# Patient Record
Sex: Female | Born: 1990 | Race: White | Hispanic: No | Marital: Single | Smoking: Former smoker
Health system: Southern US, Community
[De-identification: ages and names within clinical notes are randomized; demographics above are authoritative.]

## PROBLEM LIST (undated history)

## (undated) DIAGNOSIS — T50901A Poisoning by unspecified drugs, medicaments and biological substances, accidental (unintentional), initial encounter: Secondary | ICD-10-CM

## (undated) DIAGNOSIS — Z8619 Personal history of other infectious and parasitic diseases: Secondary | ICD-10-CM

## (undated) DIAGNOSIS — Z87440 Personal history of urinary (tract) infections: Secondary | ICD-10-CM

## (undated) DIAGNOSIS — F419 Anxiety disorder, unspecified: Secondary | ICD-10-CM

## (undated) DIAGNOSIS — A549 Gonococcal infection, unspecified: Secondary | ICD-10-CM

## (undated) DIAGNOSIS — A749 Chlamydial infection, unspecified: Secondary | ICD-10-CM

## (undated) DIAGNOSIS — F329 Major depressive disorder, single episode, unspecified: Secondary | ICD-10-CM

## (undated) DIAGNOSIS — J9600 Acute respiratory failure, unspecified whether with hypoxia or hypercapnia: Secondary | ICD-10-CM

## (undated) DIAGNOSIS — L709 Acne, unspecified: Secondary | ICD-10-CM

## (undated) DIAGNOSIS — R569 Unspecified convulsions: Secondary | ICD-10-CM

## (undated) DIAGNOSIS — F32A Depression, unspecified: Secondary | ICD-10-CM

## (undated) HISTORY — PX: COLPOSCOPY: SHX161

## (undated) HISTORY — DX: Unspecified convulsions: R56.9

## (undated) HISTORY — DX: Acne, unspecified: L70.9

## (undated) HISTORY — PX: TYMPANOSTOMY TUBE PLACEMENT: SHX32

## (undated) HISTORY — DX: Gonococcal infection, unspecified: A54.9

## (undated) HISTORY — DX: Personal history of urinary (tract) infections: Z87.440

## (undated) HISTORY — DX: Major depressive disorder, single episode, unspecified: F32.9

## (undated) HISTORY — PX: GYNECOLOGIC CRYOSURGERY: SHX857

## (undated) HISTORY — DX: Anxiety disorder, unspecified: F41.9

## (undated) HISTORY — DX: Chlamydial infection, unspecified: A74.9

## (undated) HISTORY — DX: Personal history of other infectious and parasitic diseases: Z86.19

## (undated) HISTORY — DX: Depression, unspecified: F32.A

---
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
## 2008-01-11 ENCOUNTER — Other Ambulatory Visit: Admission: RE | Admit: 2008-01-11 | Discharge: 2008-01-11 | Payer: Self-pay | Admitting: Obstetrics and Gynecology

## 2008-05-11 ENCOUNTER — Ambulatory Visit: Payer: Self-pay | Admitting: Obstetrics and Gynecology

## 2008-06-07 ENCOUNTER — Ambulatory Visit: Payer: Self-pay | Admitting: Obstetrics and Gynecology

## 2008-07-04 ENCOUNTER — Ambulatory Visit: Payer: Self-pay | Admitting: Obstetrics and Gynecology

## 2008-10-24 ENCOUNTER — Ambulatory Visit: Payer: Self-pay | Admitting: Obstetrics and Gynecology

## 2009-03-20 ENCOUNTER — Other Ambulatory Visit: Admission: RE | Admit: 2009-03-20 | Discharge: 2009-03-20 | Payer: Self-pay | Admitting: Obstetrics and Gynecology

## 2009-03-20 ENCOUNTER — Encounter: Payer: Self-pay | Admitting: Obstetrics and Gynecology

## 2009-03-20 ENCOUNTER — Ambulatory Visit: Payer: Self-pay | Admitting: Obstetrics and Gynecology

## 2009-08-21 ENCOUNTER — Ambulatory Visit: Payer: Self-pay | Admitting: Obstetrics and Gynecology

## 2010-02-26 ENCOUNTER — Ambulatory Visit: Payer: Self-pay | Admitting: Obstetrics and Gynecology

## 2010-04-30 ENCOUNTER — Ambulatory Visit: Payer: Self-pay | Admitting: Internal Medicine

## 2010-04-30 DIAGNOSIS — J019 Acute sinusitis, unspecified: Secondary | ICD-10-CM

## 2010-04-30 DIAGNOSIS — R51 Headache: Secondary | ICD-10-CM

## 2010-04-30 DIAGNOSIS — F411 Generalized anxiety disorder: Secondary | ICD-10-CM | POA: Insufficient documentation

## 2010-04-30 DIAGNOSIS — R519 Headache, unspecified: Secondary | ICD-10-CM | POA: Insufficient documentation

## 2010-04-30 DIAGNOSIS — L708 Other acne: Secondary | ICD-10-CM

## 2010-09-25 NOTE — Letter (Signed)
Summary: Out of School  [facility]  [address] [place], [place] [address]   Phone: [phone]  Fax: [phone]    April 30, 2010   Student:  [name]    To Whom It May Concern:   For Medical reasons, please excuse the above named student from school for the following dates:  Start:   April 30, 2010  End:     If you need additional information, please feel free to contact our office.   Sincerely,    Madelin Headings MD    ****This is a legal document and cannot be tampered with.  Schools are authorized to verify all information and to do so accordingly.

## 2010-09-25 NOTE — Letter (Signed)
Summary: Out of School  [facility]  [address] [place], [place] [address]   Phone: [phone]  Fax: [phone]    April 30, 2010   Student:  Pearline Cables Weiskopf    To Whom It May Concern:   For Medical reasons, please excuse the above named student from school for the following dates:  Start:   April 30, 2010  End:    September 9th  or when fever is gone  If you need additional information, please feel free to contact our office.   Sincerely,    Madelin Headings MD    ****This is a legal document and cannot be tampered with.  Schools are authorized to verify all information and to do so accordingly.

## 2010-09-25 NOTE — Assessment & Plan Note (Signed)
Summary: NEW ACUTE/?SINUS INF/NJR   Vital Signs:  Patient profile:   20 year old female Menstrual status:  regular LMP:     03/31/2010 Height:      60.5 inches Weight:      104 pounds BMI:     20.05 Temp:     98.4 degrees F oral Pulse rate:   66 / minute BP sitting:   110 / 60  (right arm) Cuff size:   regular  Vitals Entered By: Romualdo Bolk, CMA (AAMA) (April 30, 2010 3:03 PM) CC: Sinus pressure, ha's, sore throat, coughing, fever this am, runny nose, congestion.  Pt has been taking dayquil. This started 3 days ago. LMP (date): 03/31/2010 LMP - Character: normal Menarche (age onset years): 12-15   Menses interval (days): 28 Menstrual flow (days): 3-5 Menstrual Status regular Enter LMP: 03/31/2010   History of Present Illness: Laurie Chaney comes in today  for new patient acute visit for above.  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 She has no primary care physician locally but sees Dr. Delle Reining for her anxiety management and Dr.Gottsegan for her gynecologist. she was in her usual state of health until the last three days when she developed significant head congestion and then face pain and headache. Fever this am 101  .  took daquil with some help. she feels that her symptoms are getting worse. She has no history of Athlon specific allergy but does have a remote history of occasional sinusitis.   She's under treatment for acneMinocycline once a day.    Preventive Screening-Counseling & Management  Alcohol-Tobacco     Alcohol drinks/day: 0     Smoking Status: quit  Caffeine-Diet-Exercise     Caffeine use/day: 0     Does Patient Exercise: yes  Comments: she was a social smoker in high school  Hep-HIV-STD-Contraception     Dental Visit-last 6 months yes  Safety-Violence-Falls     Firearms in the Home: no firearms in the home     Smoke Detectors: yes      Drug Use:  marijuana and yes ocass.    Current Medications  (verified): 1)  Klonopin 1 Mg Tabs (Clonazepam) .Marland Kitchen.. 1 By Mouth Three Times A Day As Needed 2)  Minocycline Hcl 100 Mg Caps (Minocycline Hcl) .Marland Kitchen.. 1 By Mouth Once Daily  Allergies (verified): No Known Drug Allergies  Past History:  Past Medical History: varicella  depression  hx of wellbutrin and lexapro.   seizure  disorder of childhood .Marland Kitchen out grown and none   since  4th grade .  total of 6  Anxiety Headache  Migraine .   Acne   on minocin.  previous pcp pediatrician.   was Dr Peter Congo    Past Surgical History: Tubes in ears age 27-4  Past History:  Care Management: Dermatology: Posey Rea of name Psychiatry: Evelene Croon  Family History: Father: Healthy Mother: HBP, depression, anxiety Siblings: Sister- Healthy; Brother-Healthy  Social History: Occupation: [employer]- Student Single HHo f 5 8 hours of sleep Former Smoker Alcohol use-no  graduated from [employer] Drug use-yes-History in [employer]  Regular exercise-yes works Engineer, manufacturing .    Smoking Status:  quit Caffeine use/day:  0 Does Patient Exercise:  yes Occupation:  employed Drug Use:  marijuana, yes ocass Dental Care w/in 6 mos.:  yes  Review of Systems       The patient complains of fever.  The patient denies anorexia, vision loss, decreased  hearing, hoarseness, chest pain, dyspnea on exertion, peripheral edema, prolonged cough, abdominal pain, melena, hematochezia, severe indigestion/heartburn, difficulty walking, abnormal bleeding, enlarged lymph nodes, and angioedema.         monthly  Has meds  as needed.   Physical Exam  General:  alert, well-developed, well-nourished, and well-hydrated.  very  congested in nad  Head:  normocephalic and atraumatic.   Eyes:  PERRL, EOMs full, conjunctiva clear  Ears:  scarring right tm  clear fluid left ear  no bulging or redness Nose:  4+ ocludded  tender facial pain and some frontal  Mouth:  tonsil 1+ no exudate or edema  Neck:  shoccy nodes  Lungs:  Normal  respiratory effort, chest expands symmetrically. Lungs are clear to auscultation, no crackles or wheezes.no dullness.   Heart:  Normal rate and regular rhythm. S1 and S2 normal without gallop, murmur, click, rub or other extra sounds.no lifts.   Abdomen:  Bowel sounds positive,abdomen soft and non-tender without masses, organomegaly or   noted. Msk:  no joint warmth and no redness over joints.   Pulses:  pulses intact without delay   Extremities:  no clubbing cyanosis or edema  Neurologic:  alert & oriented X3, strength normal in all extremities, and gait normal.   Skin:  turgor normal, color normal, no ecchymoses, no petechiae, tattoo(s), and body piercing(s).   Cervical Nodes:  No lymphadenopathy noted Inguinal Nodes:  No significant adenopathy Psych:  Oriented X3, normally interactive, and good eye contact.  nl speech and thought    Impression & Recommendations:  Problem # 1:  SINUSITIS - ACUTE-NOS (ICD-461.9) with fever  poss viral but symptoms more severe .  Her updated medication list for this problem includes:    Minocycline Hcl 100 Mg Caps (Minocycline hcl) .Marland Kitchen... 1 by mouth once daily    Amoxicillin-pot Clavulanate 875-125 Mg Tabs (Amoxicillin-pot clavulanate) .Marland Kitchen... 1 by mouth two times a day for sinusitis  Instructed on treatment. Call if symptoms persist or worsen.   Problem # 2:  ANXIETY (ICD-300.00) under treatment by psychiatrist, stable Her updated medication list for this problem includes:    Klonopin 1 Mg Tabs (Clonazepam) .Marland Kitchen... 1 by mouth three times a day as needed  Problem # 3:  ACNE, MILD (ICD-706.1) okay to stop the minocycline while she is on the other antibiotic. Her updated medication list for this problem includes:    Minocycline Hcl 100 Mg Caps (Minocycline hcl) .Marland Kitchen... 1 by mouth once daily    Amoxicillin-pot Clavulanate 875-125 Mg Tabs (Amoxicillin-pot clavulanate) .Marland Kitchen... 1 by mouth two times a day for sinusitis  Problem # 4:  HEADACHE (ICD-784.0) from  above     also possible hormonally incuded.  Complete Medication List: 1)  Klonopin 1 Mg Tabs (Clonazepam) .Marland Kitchen.. 1 by mouth three times a day as needed 2)  Minocycline Hcl 100 Mg Caps (Minocycline hcl) .Marland Kitchen.. 1 by mouth once daily 3)  Amoxicillin-pot Clavulanate 875-125 Mg Tabs (Amoxicillin-pot clavulanate) .Marland Kitchen.. 1 by mouth two times a day for sinusitis  Patient Instructions: 1)  call if  not getting better in the next 3-5 days or if fever after 2 days. 2)  can use afrin nose spray for 2-3 days to relieve the pressure . 3)  can temp stop the  minocin  while  on augmentin  Prescriptions: AMOXICILLIN-POT CLAVULANATE 875-125 MG TABS (AMOXICILLIN-POT CLAVULANATE) 1 by mouth two times a day for sinusitis  #20 x 0   Entered and Authorized by:   Neta Mends  Jayley Hustead MD   Signed by:   Madelin Headings MD on 04/30/2010   Method used:   Electronically to        [facility]* (retail)       [address]       [place], [place]  [id number]       Ph: [phone]       Fax: [phone]   RxID:   [id number]

## 2010-09-29 ENCOUNTER — Encounter (INDEPENDENT_AMBULATORY_CARE_PROVIDER_SITE_OTHER): Payer: Self-pay | Admitting: Gynecology

## 2010-09-29 ENCOUNTER — Other Ambulatory Visit (HOSPITAL_COMMUNITY)
Admission: RE | Admit: 2010-09-29 | Discharge: 2010-09-29 | Disposition: A | Discharge status: Home / Self Care | Payer: BC Managed Care – PPO | Source: Ambulatory Visit | Attending: Obstetrics and Gynecology | Admitting: Obstetrics and Gynecology

## 2010-09-29 ENCOUNTER — Other Ambulatory Visit: Payer: Self-pay | Admitting: Obstetrics and Gynecology

## 2010-09-29 DIAGNOSIS — R82998 Other abnormal findings in urine: Secondary | ICD-10-CM

## 2010-09-29 DIAGNOSIS — Z124 Encounter for screening for malignant neoplasm of cervix: Secondary | ICD-10-CM | POA: Insufficient documentation

## 2010-09-29 DIAGNOSIS — Z01419 Encounter for gynecological examination (general) (routine) without abnormal findings: Secondary | ICD-10-CM

## 2010-09-29 DIAGNOSIS — Z113 Encounter for screening for infections with a predominantly sexual mode of transmission: Secondary | ICD-10-CM

## 2010-09-29 DIAGNOSIS — B373 Candidiasis of vulva and vagina: Secondary | ICD-10-CM

## 2010-10-27 ENCOUNTER — Ambulatory Visit (INDEPENDENT_AMBULATORY_CARE_PROVIDER_SITE_OTHER): Payer: BC Managed Care – PPO | Admitting: Internal Medicine

## 2010-10-27 ENCOUNTER — Telehealth: Payer: Self-pay | Admitting: *Deleted

## 2010-10-27 ENCOUNTER — Encounter: Payer: Self-pay | Admitting: Internal Medicine

## 2010-10-27 DIAGNOSIS — R109 Unspecified abdominal pain: Secondary | ICD-10-CM

## 2010-10-27 DIAGNOSIS — J988 Other specified respiratory disorders: Secondary | ICD-10-CM

## 2010-10-27 DIAGNOSIS — L708 Other acne: Secondary | ICD-10-CM

## 2010-10-27 DIAGNOSIS — L709 Acne, unspecified: Secondary | ICD-10-CM

## 2010-10-27 DIAGNOSIS — F329 Major depressive disorder, single episode, unspecified: Secondary | ICD-10-CM

## 2010-10-27 DIAGNOSIS — G43909 Migraine, unspecified, not intractable, without status migrainosus: Secondary | ICD-10-CM

## 2010-10-27 DIAGNOSIS — R509 Fever, unspecified: Secondary | ICD-10-CM

## 2010-10-27 DIAGNOSIS — Z87440 Personal history of urinary (tract) infections: Secondary | ICD-10-CM

## 2010-10-27 DIAGNOSIS — N390 Urinary tract infection, site not specified: Secondary | ICD-10-CM

## 2010-10-27 LAB — POCT URINALYSIS DIPSTICK
Bilirubin, UA: NEGATIVE
Glucose, UA: NEGATIVE
Ketones, UA: NEGATIVE
Nitrite, UA: NEGATIVE
Spec Grav, UA: 1.015
Urobilinogen, UA: 0.2
pH, UA: 5.5

## 2010-10-27 LAB — POCT URINE PREGNANCY: Preg Test, Ur: NEGATIVE

## 2010-10-27 MED ORDER — HYDROCODONE-HOMATROPINE 5-1.5 MG/5ML PO SYRP: 5.0000 mL | ORAL_SOLUTION | ORAL | Status: AC | PRN

## 2010-10-27 MED ORDER — CIPROFLOXACIN HCL 500 MG PO TABS: 500.0000 mg | ORAL_TABLET | Freq: Two times a day (BID) | ORAL | Status: AC

## 2010-10-27 NOTE — Telephone Encounter (Signed)
[facility] Triage Call Report Triage Record Num: [id number] Operator: Valene Bors Patient Name: Laurie Chaney Call Date & Time: 10/25/2010 2:31:44PM Patient Phone: [phone] PCP: Duanne Guess. [place] Patient Gender: Female PCP Fax : [phone] Patient DOB: 05/01/1991 Practice Name: [facility] Reason for Call: wt#[id number] LMP 10/24/10 Dad/Randy is calling about Kendre c/o not feeling well yesterday 10/24/10 after eating a sandwich from [employer] she started vomiting. This morning temp = 104.0oral at 12noon. She took Ibuproben 600mg s PO at 12:00 and fever stayed at 103.9. She took 2 Tylenol ES at and temp now =102.8 oral. She last vomited about 1300. She is drinking water. Advised sips and to increase amount slowly if vomiting subsides. Care advice per Nausea/Vomiting Protocol and advised to call back if fever persists or with any other concerns. Advised to go to ER if needed per Protocol Guidelines. Protocol(s) Used: Nausea or Vomiting Recommended Outcome per Protocol: Provide Home/Self Care Reason for Outcome: Sudden onset of diarrhea, usually with abdominal pain, nausea and sometimes vomiting, occurring within 36 hours after eating unpasteurized, raw or undercooked foods OR drinking unpurified or nonchlorinated water All other situations Care Advice: Call provider if symptoms continue for 24 hours, blood in vomit, severe abdominal pain, fever over 101.5 F (38.6 C), rapid breathing or pulse, or severe vomiting with diarrhea. ~ Go to the ED if you have developed bloody diarrhea or signs and symptoms of dehydration, such as dry mouth and tongue; increased pulse rate at rest; concentrated urine; no urine output for 8 hours or more; increasing weakness or drowsiness, or lightheadedness when trying to sit upright or standing. ~ Call provider immediately if vomiting and diarrhea persist more than 3 days; develop a temperature of 101.5 F (38.6 C) or more; or if having severe  abdominal pain or abdominal swelling. ~ ~ SYMPTOM / CONDITION MANAGEMENT See a provider immediately if 41 years of age or older, immunocompromised, or diagnosed with a chronic disease, or if pregnant. ~ ~ CAUTIONS Nausea Care Advice: - Drink small amounts of clear, sweetened liquids or ice cold drinks. - Eat light, bland foods such as saltine crackers or plain bread. - Do not eat high fat, highly seasoned, high fiber, or high sugar content foods. - Avoid mixing hot food and cold foods. - Eat smaller, more frequent meals. - Rest as much as possible in a sitting or in a propped lying position. Do not lie flat for at least 2 hours after eating. - Do not take pain medication (such as aspirin, NSAIDs) while nauseated. - Rest as much as possible until symptoms improve since activity may worsen nausea. ~ Go to the ED if you have developed signs and symptoms of dehydration such as very dry mouth and tongue; increased pulse rate at rest; no urine output for 8 hours or more; increasing weakness or drowsiness, or lightheadedness when trying to sit upright or standing. ~ Suspected Food-Borne Illness Care: Drink 2-3 quarts (2-3 liters) of low sugar content fluids, including nonprescription oral hydration solution, per day ~ 10/25/2010 2:57:52PM Page 1 of 2 [id number] [facility] Triage Call Report Patient Name: Tylee Yum continuation page/s unless directed otherwise by provider. - If accompanied by vomiting, take the fluids in frequent small sips or suck on ice chips. - Do not eat solid food until vomiting subsides. - Eat easily digested foods (such as bananas, rice, applesauce, toast, cooked cereals, soup, crackers, baked or boiled potato, or baked chicken or [place] without skin). - Do not eat high fiber, high  fat, high sugar content foods, or highly seasoned foods. - Do not drink caffeinated or alcoholic beverages. - Avoid milk and milk products while having symptoms. - As  symptoms improve, gradually add back to diet. - Do not use antidiarrheal medications because they may slow elimination of bacteria causing the symptoms. - Application of A&D ointment or witch hazel medicated pads may help anal irritation. - Keep activity at a minimum. - Consult your provider for advice regarding continuing prescription medication. Vomiting Care Advice: Do not eat solid foods until vomiting subsides. - Begin taking fluids by sucking on ice chips or popsicles or taking sips of cool clear, nonprescription oral rehydration solution). - Gradually drink larger amounts of these fluids so that you are drinking six to eight 8 oz. (.2 liter) of fluids a day. - Keep activity to a minimum. - After vomiting subsides, eat smaller, more frequent meals of easily digested foods such as crackers, toast, bananas, rice, cooked cereal, applesauce, broth, baked or mashed potatoes, chicken or [place] without skin. Eat slowly. - Take fluids 30 minutes before or 60 minutes after meals. - Avoid high fat, highly seasoned, high fiber or high sugar content foods. - Avoid extremely hot or cold foods. - Do not take pain medication (such as aspirin, NSAIDs) while nauseated or vomiting. - Consult your provider for advice regarding continuing prescription medication. - Rest as much as possible in a sitting or in a propped lying position. Do not lie flat for at least 2 hours after eating. ~ Avoid food poisoning by safe handling of foods: - Washing hands thoroughly before food preparation or handling. - Cook foods to proper temperature. - Promptly refrigerate perishable foods within 1 to 2 hours of cooking. - Clean food preparation area and utensils with hot soapy water. - Wash all fresh produce including pre-washed fruits and vegetables. - Prepare fruits and vegetables on a different cutting board than meats or poultry. ~ 10/25/2010 2:57:52PM Page 2 of 2 CAN_TriageRpt_[email or web address]

## 2010-10-27 NOTE — Patient Instructions (Signed)
Take antibiotic for presumed  UTI Cough med as tolerated  Increase  Fluids  To prevent dehydration. Expect improvement either way in the next 48 - 72 hours . Call or recheck  if fever not gone in 48-72 hours .Marland Kitchen

## 2010-10-29 LAB — URINE CULTURE: Colony Count: >=100000

## 2010-10-30 ENCOUNTER — Encounter: Payer: Self-pay | Admitting: Internal Medicine

## 2010-10-30 DIAGNOSIS — F329 Major depressive disorder, single episode, unspecified: Secondary | ICD-10-CM | POA: Insufficient documentation

## 2010-10-30 DIAGNOSIS — G43909 Migraine, unspecified, not intractable, without status migrainosus: Secondary | ICD-10-CM | POA: Insufficient documentation

## 2010-10-30 DIAGNOSIS — L709 Acne, unspecified: Secondary | ICD-10-CM | POA: Insufficient documentation

## 2010-10-30 DIAGNOSIS — Z87440 Personal history of urinary (tract) infections: Secondary | ICD-10-CM | POA: Insufficient documentation

## 2010-10-30 DIAGNOSIS — F32A Depression, unspecified: Secondary | ICD-10-CM | POA: Insufficient documentation

## 2010-10-30 NOTE — Progress Notes (Signed)
Left message to call back  

## 2010-10-30 NOTE — Progress Notes (Signed)
Pt's mom aware of the results.

## 2010-10-30 NOTE — Progress Notes (Signed)
  Subjective:    Patient ID: Laurie Chaney, female    DOB: Dec 21, 1990, 20 y.o.   MRN: [id number]  HPI Comes in with mom for acute visit after having the onset of high fever 104 over the weekend 2 -3 days and chills  . Then developed cough and back pain. No flu shot this year.   No sig st .  No diarrhea or rash but feels sick.  Had some vomiting  Able to keep liqids down ? Some dysuria and frequency . Gets utis usually rx by her gyne dr Oletha Blend. ? When last one was  Months ago.   Using antipyretics no other rx  .  LMP 2 weeks ago.   Past Medical History  Diagnosis Date  . Acne   . Migraine   . Anxiety   . Seizures     had 6 none since 4th grade  . Depression     hx of wellbutrin and lexapro use  . History of recurrent UTIs     as a child had evaluation  . History of varicella    Past Surgical History  Procedure Date  . Tympanostomy tube placement     age 53-4    reports that she has never smoked. She does not have any smokeless tobacco history on file. She reports that she drinks alcohol. She reports that she does not use illicit drugs. family history includes Anxiety disorder in her mother; Depression in her mother; and Hypertension in her mother. No Known Allergies   Review of Systems On accutane for acne monitoring labs...has had sti check past month.Marland Kitchen anxiety depr  rx by dr Evelene Croon and no recent change in meds,     Objective:   Physical Exam WDWN tired non toxic in nad  HEENT: Normocephalic ;atraumatic , Eyes;  PERRL, EOMs  Full, lids and conjunctiva clear,,Ears: no deformities, canals nl, TM landmarks normal, Nose: no deformity or discharge  Mouth : OP clear without lesion or edema . Neck supple and no masses  Chest:  Clear to A&P without wheezes rales or rhonchi CV:  S1-S2 no gallops or murmurs peripheral perfusion is normal Abdomen:  Sof,t normal bowel sounds without hepatosplenomegaly, no guarding rebound or masses no CVA tenderness  Some low back pain   Ext No  clubbing cyanosis or edema Skin : nl cap refill  o rashes Neuro intact  Ortho : neg swelling    Assessment & Plan:  Fever Cough  prob viral  Almost flu like  Poss uti pyelo .     Plan cx urine and begin meds and close  observation .    Expectant management. Fluids rest. To follow up depending on results and progress.    Personally edited  And input on past and surgical hx.  WKP

## 2011-01-06 NOTE — H&P (Signed)
Laurie Chaney, STILLION NO.:  [id number]   MEDICAL RECORD NO.:  [id number]          PATIENT TYPE:  INP   LOCATION:  0103                          FACILITY:  [facility]   PHYSICIAN:  Lalla Brothers, MDDATE OF BIRTH:  [birth year]   DATE OF ADMISSION:  05/09/2007  DATE OF DISCHARGE:                       PSYCHIATRIC ADMISSION ASSESSMENT   IDENTIFICATION:  This 20 year old female, 12th grade student at [employer], is admitted emergently voluntarily in transfer from [facility] Emergency Department for inpatient stabilization and  treatment of suicide risk and depression.  The patient was brought to  the emergency department by parents at 2100 on May 08, 2007 as she  was unable to contract for safety, planning overdose, car crash, or  cutting herself to bleed out to die.  Mother is concerned that the  patient's Zoloft is causing suicidal ideation as the patient is being  tapered from 200 mg to 150 mg over the last two days and Cymbalta has  been started at 30 mg every morning from Dr. Evelene Croon.   HISTORY OF PRESENT ILLNESS:  The patient was evaluated by the ACT Team  just after midnight and observed and managed in the emergency department  with some behavioral stabilization but no improvement in mood or  cognitive fixation on suicidality.  The patient was transferred to the  [facility] the following morning with parents and patient  exhausted on arrival in early afternoon.  The patient has a pattern of  excessive sleep, over 12 hours daily, atypical hysteroid dysphoria, and  now has been withdrawn for the last day or two as suicidality has been  intensifying.  She has a three-year history of self-cutting and has  acute lacerations on the right wrist.  She has high anxiety continuously  over the last seven days.  Father has intense expressed emotion as he  defines the patient's strengths and the undermining effect of  relinquishing  control to the treatment program.  Father does not want  the patient to be stressed any further by being separated from her  schoolwork.  Mother and the patient seem to fuse in shared experiences  of depression and previous substance use.  In fact, the patient had  benzodiazepines in her urine drug screen without being prescribed these.  She has not clarified the source of the benzodiazepines thus far and it  is difficult to discern whether there is any disinhibiting or depressing  effect on the patient.  The patient also acknowledges having used  cocaine, two or three lines daily for 3-1/2 months, last used in March  of 2008.  She has used alcohol for three months, apparently starting in  the spring of 2008.  She had cannabis weekly for 1-1/2 years, last using  in June of 2008, having started at age 52.  The patient seeks to enter  the field of psychiatry.  She and the family are actively planning and  engaging in college matching for the patient and she takes all AP and  honors courses.  She has been seeing [name] in therapy at [phone],  having therapy for cutting since mid-2007.  She sees Dr. Evelene Croon with  last appointment May 03, 2007 at [phone], though the patient  apparently did not disclose to her at that time the sustained anxiety  and despair but now seemed to contribute to decompensation and loss of  control.  They suggest that Zoloft taper is planned for 4-6 weeks while  Cymbalta dose is targeted to advance to 60 mg daily from current 30 mg  every morning as Zoloft taper is completed.  She apparently had  Wellbutrin prior to Zoloft that intensified anxiety and did not help  depression, also likely having a minor tranquilizer that she may still  use as needed.   PAST MEDICAL HISTORY:  The patient has been under the care of Dr. Deeann Saint at [facility] in the past.  She has a history of  seizures, early in life which apparently remitted by around age  6 or  puberty.  The patient and family describe these as being staring spells  with loss of consciousness but no tonic-clonic activity.  She has had  two further episodes since age 53, one of which was seen in the  emergency department July 17, 2005.  At that time, she had closed  her fingers in a cash register drawer with significant pain with the ED  record describing hyperventilation and then syncope, falling backward  and striking the back of her head.  CT scan of the head at that time  without contrast was negative.  The other interim syncopal episode  occurred at the time of significant environmental heat.  Parents seem to  be describing petit mal or partial complex seizures in the past.  However, the emergency department documented a head contusion in the  process of collapsing and syncope that appeared to be triggered by  hyperventilation, though the family acknowledges that no spells in the  past have been triggered by pain.  The patient had a CT scan of the  abdomen and pelvis for abdominal pain June 13, 2005 that was also  normal.  The patient had a right ankle sprain in 2002 and a left wrist  fracture from a fall from monkey bars in 2000.  The patient has contact  lenses.  Last GYN exam was recently while last dental exam was in March  of 2008.  The patient has had no medication allergies.  She has had no  heart murmur or arrhythmia.   REVIEW OF SYSTEMS:  The patient denies difficulty with gait, gaze or  continence.  She denies exposure to communicable disease or toxins.  She  denies rash, jaundice or purpura currently.  There is no chest pain,  palpitations or presyncope.  There is no abdominal pain, nausea,  vomiting or diarrhea.  There is no dysuria or arthralgia.   IMMUNIZATIONS:  Up-to-date.   FAMILY HISTORY:  The patient lives with both parents, having conflict  with siblings, especially over issues of the truth which may trigger  anger and the need to walk  away.  Aunt and uncle have bipolar disorder.  Mother has had past substance abuse and more recent significant  depression.   SOCIAL AND DEVELOPMENTAL HISTORY:  The patient is a 12th grade student  at [employer] in AP and honors classes.  Father is emphatic  that the patient not be delayed in her academic pursuits by excessive  time in the hospital and both parents wish that the patient had time to  study  while in the hospital.  The patient offers little interest herself  in such study and reassures parents that she will catch up and keep up  again.  Parents speculate that she may be doing research in the process  of becoming a psychiatrist, relative to her own emotions and behavior.  The patient does not address sexual behavior but she does acknowledge  substance use since age 50 with various substances over the last three  years episodically, somewhat simultaneous with her history of cutting.  She denies legal charges.   ASSETS:  The patient is highly intelligent.   MENTAL STATUS EXAM:  Weight is 42.8 kg.  Blood pressure is 107/72 with  heart rate of 75 (sitting) and 117/74 with heart rate of 106 (standing).  She appears to be left-handed, having self-inflicted right wrist  lacerations.  Cranial nerves 2-12 are intact.  Speech and communication  are normal.  Muscle strengths and tone are normal.  There are no  pathological reflexes or soft neurologic findings.  There are no  abnormal involuntary movements.  Gait and gaze are intact.  The patient  is quiet while father is intense in his emotional expression.  Mother  seems somewhat fused in understanding the patient's current symptoms and  experiences.  The family therefore becomes significantly organized  around the patient's conflicts and difficulties.  The patient has had  high anxiety for seven days in a pattern of generalized anxiety that  likely extends over several years and has been self-medicated in various  ways.   She has relapsing depression currently severe and seems to have  exhausted herself with self-medicating and self-cutting interventions  that have not provided any sustained relief.  She has suicidal ideation  with plan to overdose, crash her car, or cut herself so as to bleed to  death.  She is not homicidal or assaultive.  The patient assumes a quiet  and withdrawn posture while maintaining and excepting gaze with atypical  depressive features and significant hysteroid dysphoria.  She has  suicide plans with inability to contract for safety.  She does not  present a pervasive intoxication or toxic state for medication including  no delirium or organicity evident.  She almost seems physiologically at  rest with her current emotional decompensation.  Leaden fatigue seems  likely.  She has no mania or psychosis evident.  There is no definite  dissociation or post-traumatic anxiety.   IMPRESSION:  AXIS I:  Major depression, recurrent, severe with atypical  features.  Generalized anxiety disorder.  Psychoactive substance abuse  not otherwise specified.  Other specified family circumstances.  AXIS II:  Diagnosis deferred.  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  AXIS IV:  Stressors:  Phase of life--severe, acute and chronic; school--  moderate, acute and chronic; family--moderate, acute and chronic.  AXIS V:  GAF on admission 35; highest in last year estimated at 75.   PLAN:  The patient is admitted for inpatient adolescent psychiatric and  multidisciplinary multimodal behavioral health treatment in a team-based  programmatic locked psychiatric unit.  Will accelerate taper of Zoloft  as mother is strongly suspicious that the patient has some suicidality  from Zoloft while expecting taper to take 4-6 weeks  which may render an  ambivalent and hopeless posture.  Will monitor for SSRI withdrawal and  accelerate taper while building up Cymbalta currently at 30 mg mornings  to be continued with a target dose of 60 mg daily from outpatient care.  Cognitive behavioral therapy, anger management, interpersonal therapy,  individuation separation, identity consolidation, social and  communication skill training, problem-solving and coping skill training,  habit reversal and substance abuse intervention can be undertaken.   ESTIMATED LENGTH OF STAY:  Five days with target symptoms for discharge  being stabilization of suicide risk and mood, stabilization of anxiety  and withdrawal undermining coping with depression and generalization of  the capacity for safe, effective participation in subsequent outpatient  treatment.      Lalla Brothers, MD  Electronically Signed     GEJ/MEDQ  D:  05/10/2007  T:  05/10/2007  Job:  [phone]
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
## 2011-01-26 ENCOUNTER — Ambulatory Visit: Payer: BC Managed Care – PPO | Admitting: Obstetrics and Gynecology

## 2011-02-26 ENCOUNTER — Ambulatory Visit: Payer: BC Managed Care – PPO | Admitting: Obstetrics and Gynecology

## 2011-05-26 ENCOUNTER — Emergency Department (HOSPITAL_COMMUNITY)
Admission: EM | Admit: 2011-05-26 | Discharge: 2011-05-27 | Disposition: A | Discharge status: Home / Self Care | Payer: BC Managed Care – PPO | Attending: Emergency Medicine | Admitting: Emergency Medicine

## 2011-05-26 DIAGNOSIS — G40909 Epilepsy, unspecified, not intractable, without status epilepticus: Secondary | ICD-10-CM | POA: Insufficient documentation

## 2011-05-26 DIAGNOSIS — T43591A Poisoning by other antipsychotics and neuroleptics, accidental (unintentional), initial encounter: Secondary | ICD-10-CM | POA: Insufficient documentation

## 2011-05-26 DIAGNOSIS — T424X4A Poisoning by benzodiazepines, undetermined, initial encounter: Secondary | ICD-10-CM | POA: Insufficient documentation

## 2011-05-26 DIAGNOSIS — R404 Transient alteration of awareness: Secondary | ICD-10-CM | POA: Insufficient documentation

## 2011-05-26 DIAGNOSIS — Z79899 Other long term (current) drug therapy: Secondary | ICD-10-CM | POA: Insufficient documentation

## 2011-05-27 LAB — DIFFERENTIAL
Basophils Absolute: 0 10*3/uL (ref 0.0–0.1)
Basophils Relative: 1 % (ref 0–1)
Eosinophils Absolute: 0.1 10*3/uL (ref 0.0–0.7)
Eosinophils Relative: 2 % (ref 0–5)
Lymphocytes Relative: 38 % (ref 12–46)
Lymphs Abs: 2.7 10*3/uL (ref 0.7–4.0)
Monocytes Absolute: 0.7 10*3/uL (ref 0.1–1.0)
Monocytes Relative: 10 % (ref 3–12)
Neutro Abs: 3.6 10*3/uL (ref 1.7–7.7)
Neutrophils Relative %: 50 % (ref 43–77)

## 2011-05-27 LAB — COMPREHENSIVE METABOLIC PANEL
ALT: 8 U/L (ref 0–35)
AST: 20 U/L (ref 0–37)
Albumin: 3.8 g/dL (ref 3.5–5.2)
Alkaline Phosphatase: 62 U/L (ref 39–117)
BUN: 4 mg/dL — ABNORMAL LOW (ref 6–23)
CO2: 27 mEq/L (ref 19–32)
Calcium: 9.2 mg/dL (ref 8.4–10.5)
Chloride: 101 mEq/L (ref 96–112)
Creatinine, Ser: 0.63 mg/dL (ref 0.50–1.10)
GFR calc Af Amer: >90 mL/min (ref >90)
GFR calc non Af Amer: >90 mL/min (ref >90)
Glucose, Bld: 92 mg/dL (ref 70–99)
Sodium: 136 mEq/L (ref 135–145)
Total Protein: 7.1 g/dL (ref 6.0–8.3)

## 2011-05-27 LAB — CBC
Hemoglobin: 13.6 g/dL (ref 12.0–15.0)
MCH: 30 pg (ref 26.0–34.0)
MCHC: 34.3 g/dL (ref 30.0–36.0)
MCV: 87.2 fL (ref 78.0–100.0)
Platelets: 303 10*3/uL (ref 150–400)
RBC: 4.54 MIL/uL (ref 3.87–5.11)
RDW: 12.9 % (ref 11.5–15.5)
WBC: 7.2 10*3/uL (ref 4.0–10.5)

## 2011-05-27 LAB — RAPID URINE DRUG SCREEN, HOSP PERFORMED
Amphetamines: NOT DETECTED
Barbiturates: NOT DETECTED
Benzodiazepines: POSITIVE — AB
Cocaine: NOT DETECTED
Opiates: NOT DETECTED
Tetrahydrocannabinol: POSITIVE — AB

## 2011-05-27 LAB — ACETAMINOPHEN LEVEL: Acetaminophen (Tylenol), Serum: <15 ug/mL (ref 10–30)

## 2011-05-27 LAB — PREGNANCY, URINE: Preg Test, Ur: NEGATIVE

## 2011-05-27 LAB — SALICYLATE LEVEL: Salicylate Lvl: <2 mg/dL — ABNORMAL LOW (ref 2.8–20.0)

## 2011-06-04 LAB — MAGNESIUM: Magnesium: 2.3

## 2011-06-04 LAB — URINALYSIS, MICROSCOPIC ONLY
Bilirubin Urine: NEGATIVE
Glucose, UA: NEGATIVE
Hgb urine dipstick: NEGATIVE
Ketones, ur: NEGATIVE
Leukocytes, UA: NEGATIVE
Nitrite: NEGATIVE
Protein, ur: NEGATIVE
Specific Gravity, Urine: 1.017
Urobilinogen, UA: 0.2
pH: 6

## 2011-06-04 LAB — HEPATIC FUNCTION PANEL
ALT: 14
AST: 22
Albumin: 3.6
Alkaline Phosphatase: 68
Bilirubin, Direct: <0.1
Total Bilirubin: 0.6
Total Protein: 7.6

## 2011-06-04 LAB — CBC
HCT: 41.1
Hemoglobin: 14
MCHC: 34.1
MCV: 84.7
Platelets: 305
RBC: 4.85
RDW: 13.1
WBC: 7.2

## 2011-06-04 LAB — GAMMA GT: GGT: 11

## 2011-06-04 LAB — DIFFERENTIAL
Basophils Absolute: 0
Basophils Relative: 0
Eosinophils Absolute: 0.2
Eosinophils Relative: 3
Lymphocytes Relative: 29
Lymphs Abs: 2.1
Monocytes Absolute: 0.8
Monocytes Relative: 10
Neutro Abs: 4.2
Neutrophils Relative %: 58

## 2011-06-04 LAB — PROLACTIN: Prolactin: 7.4

## 2011-06-04 LAB — T4, FREE: Free T4: 1.27

## 2011-06-04 LAB — TSH: TSH: 1.766

## 2011-06-05 LAB — PREGNANCY, URINE: Preg Test, Ur: NEGATIVE

## 2011-06-05 LAB — RAPID URINE DRUG SCREEN, HOSP PERFORMED
Amphetamines: NOT DETECTED
Barbiturates: NOT DETECTED
Benzodiazepines: POSITIVE — AB
Cocaine: NOT DETECTED
Opiates: NOT DETECTED
Tetrahydrocannabinol: NOT DETECTED

## 2011-06-05 LAB — BASIC METABOLIC PANEL
Calcium: 9.1
Creatinine, Ser: 0.71
GFR calc Af Amer: >60
GFR calc non Af Amer: >60

## 2011-06-05 LAB — ETHANOL: Alcohol, Ethyl (B): <5

## 2011-07-18 ENCOUNTER — Emergency Department (HOSPITAL_COMMUNITY): Payer: BC Managed Care – PPO

## 2011-07-18 ENCOUNTER — Inpatient Hospital Stay (HOSPITAL_COMMUNITY)
Admission: EM | Admit: 2011-07-18 | Discharge: 2011-07-23 | DRG: 582 | Disposition: A | Discharge status: Home / Self Care | Payer: BC Managed Care – PPO | Attending: Pulmonary Disease | Admitting: Pulmonary Disease

## 2011-07-18 DIAGNOSIS — T401X4A Poisoning by heroin, undetermined, initial encounter: Principal | ICD-10-CM | POA: Diagnosis present

## 2011-07-18 DIAGNOSIS — Z87440 Personal history of urinary (tract) infections: Secondary | ICD-10-CM

## 2011-07-18 DIAGNOSIS — R739 Hyperglycemia, unspecified: Secondary | ICD-10-CM

## 2011-07-18 DIAGNOSIS — T400X1A Poisoning by opium, accidental (unintentional), initial encounter: Secondary | ICD-10-CM | POA: Diagnosis present

## 2011-07-18 DIAGNOSIS — F3289 Other specified depressive episodes: Secondary | ICD-10-CM | POA: Diagnosis present

## 2011-07-18 DIAGNOSIS — E872 Acidosis: Secondary | ICD-10-CM

## 2011-07-18 DIAGNOSIS — R7309 Other abnormal glucose: Secondary | ICD-10-CM | POA: Diagnosis present

## 2011-07-18 DIAGNOSIS — T40601A Poisoning by unspecified narcotics, accidental (unintentional), initial encounter: Secondary | ICD-10-CM

## 2011-07-18 DIAGNOSIS — J9690 Respiratory failure, unspecified, unspecified whether with hypoxia or hypercapnia: Secondary | ICD-10-CM

## 2011-07-18 DIAGNOSIS — J9600 Acute respiratory failure, unspecified whether with hypoxia or hypercapnia: Secondary | ICD-10-CM | POA: Diagnosis present

## 2011-07-18 DIAGNOSIS — J019 Acute sinusitis, unspecified: Secondary | ICD-10-CM

## 2011-07-18 DIAGNOSIS — L708 Other acne: Secondary | ICD-10-CM

## 2011-07-18 DIAGNOSIS — L709 Acne, unspecified: Secondary | ICD-10-CM

## 2011-07-18 DIAGNOSIS — F411 Generalized anxiety disorder: Secondary | ICD-10-CM | POA: Insufficient documentation

## 2011-07-18 DIAGNOSIS — F329 Major depressive disorder, single episode, unspecified: Secondary | ICD-10-CM

## 2011-07-18 DIAGNOSIS — R51 Headache: Secondary | ICD-10-CM

## 2011-07-18 DIAGNOSIS — T50901A Poisoning by unspecified drugs, medicaments and biological substances, accidental (unintentional), initial encounter: Secondary | ICD-10-CM | POA: Diagnosis present

## 2011-07-18 DIAGNOSIS — J690 Pneumonitis due to inhalation of food and vomit: Secondary | ICD-10-CM | POA: Diagnosis present

## 2011-07-18 DIAGNOSIS — R509 Fever, unspecified: Secondary | ICD-10-CM | POA: Diagnosis present

## 2011-07-18 HISTORY — DX: Acute respiratory failure, unspecified whether with hypoxia or hypercapnia: J96.00

## 2011-07-18 HISTORY — DX: Poisoning by unspecified drugs, medicaments and biological substances, accidental (unintentional), initial encounter: T50.901A

## 2011-07-18 LAB — BLOOD GAS, ARTERIAL
Acid-base deficit: 15.6 mmol/L — ABNORMAL HIGH (ref 0.0–2.0)
FIO2: 1 %
MECHVT: 400 mL
PEEP: 5 cmH2O
RATE: 16 resp/min
pCO2 arterial: 47 mmHg — ABNORMAL HIGH (ref 35.0–45.0)
pH, Arterial: 7.096 — CL (ref 7.350–7.400)
pO2, Arterial: 603 mmHg — ABNORMAL HIGH (ref 80.0–100.0)

## 2011-07-18 LAB — CBC
Platelets: 232 10*3/uL (ref 150–400)
RBC: 4.39 MIL/uL (ref 3.87–5.11)
WBC: 41.9 10*3/uL — ABNORMAL HIGH (ref 4.0–10.5)

## 2011-07-18 LAB — RAPID URINE DRUG SCREEN, HOSP PERFORMED
Barbiturates: NOT DETECTED
Benzodiazepines: POSITIVE — AB
Tetrahydrocannabinol: POSITIVE — AB

## 2011-07-18 LAB — POCT I-STAT, CHEM 8
Calcium, Ion: 1.07 mmol/L — ABNORMAL LOW (ref 1.12–1.32)
Glucose, Bld: 539 mg/dL — ABNORMAL HIGH (ref 70–99)
HCT: 44 % (ref 36.0–46.0)
Hemoglobin: 15 g/dL (ref 12.0–15.0)
TCO2: 18 mmol/L (ref 0–100)

## 2011-07-18 MED ORDER — LORAZEPAM 2 MG/ML IJ SOLN
INTRAMUSCULAR | Status: AC
  Administered 2011-07-18
  Filled 2011-07-18: qty 1

## 2011-07-18 MED ORDER — NALOXONE HCL 1 MG/ML IJ SOLN
2.0000 mg | Freq: Once | INTRAMUSCULAR | Status: AC
  Administered 2011-07-18: 2 mg via INTRAVENOUS

## 2011-07-18 MED ORDER — PROPOFOL 10 MG/ML IV EMUL
40.0000 mg | Freq: Once | INTRAVENOUS | Status: AC
  Administered 2011-07-19: 40 mg via INTRAVENOUS

## 2011-07-18 MED ORDER — SODIUM CHLORIDE 0.9 % IV BOLUS (SEPSIS)
1000.0000 mL | Freq: Once | INTRAVENOUS | Status: AC
  Administered 2011-07-18: 1000 mL via INTRAVENOUS

## 2011-07-18 MED ORDER — PROPOFOL 10 MG/ML IV EMUL
INTRAVENOUS | Status: AC
  Administered 2011-07-19: 40 mg via INTRAVENOUS
  Filled 2011-07-18: qty 100

## 2011-07-18 MED ORDER — PROPOFOL 10 MG/ML IV EMUL
5.0000 ug/kg/min | Freq: Once | INTRAVENOUS | Status: AC
  Administered 2011-07-18 (×2): 40 mg via INTRAVENOUS
  Administered 2011-07-18: 5 ug/kg/min via INTRAVENOUS

## 2011-07-18 MED ORDER — ONDANSETRON HCL 4 MG/2ML IJ SOLN
4.0000 mg | Freq: Once | INTRAMUSCULAR | Status: AC
  Administered 2011-07-19: 4 mg via INTRAVENOUS

## 2011-07-18 NOTE — ED Notes (Signed)
Bolus of 30mg  prop given into the right ac

## 2011-07-18 NOTE — ED Provider Notes (Signed)
History     CSN: [id number] Arrival date & time: 07/18/2011 10:45 PM   First MD Initiated Contact with Patient 07/18/11 2249      Chief Complaint  Patient presents with  . Drug Overdose    Pts friend reports that "we walked in and found a syringe." Suspects heroin overdose.      Patient is a 20 y.o. female presenting with Overdose. The history is provided by a friend. The history is limited by the condition of the patient.  Drug Overdose   patient was brought in by friends after walking into a room and seeing her unresponsive on the ground with a syringe next.  The friend states she does not never used heroin.  History of depression and anxiety.  Review the chart demonstrates prior evaluation in the emergency department for mental health issues.  Prior charts do not show a prior history of diabetes.  Patient's blood sugar on arrival to the ER was in the 500s.  As reported from the friend that she start earlier today and is acting normally.  Patient was driven to the emergency department by friends and EMS was contacted.  The patient presents to the ER unresponsive  Past Medical History  Diagnosis Date  . Acne   . Migraine   . Anxiety   . Seizures     had 6 none since 4th grade  . Depression     hx of wellbutrin and lexapro use  . History of recurrent UTIs     as a child had evaluation  . History of varicella     Past Surgical History  Procedure Date  . Tympanostomy tube placement     age 34-4    Family History  Problem Relation Age of Onset  . Hypertension Mother   . Depression Mother   . Anxiety disorder Mother     History  Substance Use Topics  . Smoking status: Never Smoker   . Smokeless tobacco: Not on file  . Alcohol Use: Yes     socially    OB History    Grav Para Term Preterm Abortions TAB SAB Ect Mult Living                  Review of Systems  Unable to perform ROS   Allergies  Review of patient's allergies indicates no known allergies.  Home  Medications   Current Outpatient Rx  Name Route Sig Dispense Refill  . CLONAZEPAM 1 MG PO TABS Oral Take 1 mg by mouth 3 (three) times daily as needed.      . ISOTRETINOIN 40 MG PO CAPS Oral Take 40 mg by mouth 2 (two) times daily.        Wt 100 lb (45.36 kg)  Physical Exam  Nursing note and vitals reviewed. Constitutional: She appears well-developed. No distress.       Cyanosis of the head and superior chest  HENT:  Head: Normocephalic and atraumatic.  Eyes:       Pupils 1 mm bilaterally  Neck: Neck supple.  Cardiovascular: Normal rate, regular rhythm and normal heart sounds.   Pulmonary/Chest: Effort normal and breath sounds normal.  Abdominal: Soft. She exhibits no distension.  Genitourinary:       Normal external genitalia  Musculoskeletal: She exhibits no edema.  Neurological:       Unresponsive.  No movement of extremities.  No response to pain.  GCS 3  Skin: Skin is warm and dry.    ED  Course  INTUBATION Performed by: Lyanne Co Authorized by: Lyanne Co Consent: The procedure was performed in an emergent situation. Required items: required blood products, implants, devices, and special equipment available Patient identity confirmed: anonymous protocol, patient vented/unresponsive Time out: Immediately prior to procedure a "time out" was called to verify the correct patient, procedure, equipment, support staff and site/side marked as required. Indications: respiratory failure, airway protection and hypoxemia Intubation method: video-assisted Patient status: paralyzed (RSI) Preoxygenation: BVM Sedatives: etomidate Paralytic: succinylcholine Tube size: 7.0 mm Tube type: cuffed Ventilation between attempts: BVM Cricoid pressure: yes Cords visualized: yes Post-procedure assessment: chest rise and CO2 detector Breath sounds: equal and absent over the epigastrium Cuff inflated: yes Tube secured with: ETT holder Chest x-ray interpreted by  radiologist. Chest x-ray findings: endotracheal tube in appropriate position Patient tolerance: Patient tolerated the procedure well with no immediate complications.  ARTERIAL BLOOD GAS Performed by: Lyanne Co Authorized by: Lyanne Co Consent: The procedure was performed in an emergent situation. Required items: required blood products, implants, devices, and special equipment available Patient identity confirmed: arm band Time out: Immediately prior to procedure a "time out" was called to verify the correct patient, procedure, equipment, support staff and site/side marked as required. Location: right femoral Preparation: Patient was prepped and draped in the usual sterile fashion. Number of attempts: 1 Method: Single percutaneous needle puncture Patient tolerance: Patient tolerated the procedure well with no immediate complications.    CRITICAL CARE Performed by: Lyanne Co   Total critical care time: 45  Critical care time was exclusive of separately billable procedures and treating other patients.  Critical care was necessary to treat or prevent imminent or life-threatening deterioration.  Critical care was time spent personally by me on the following activities: development of treatment plan with patient and/or surrogate as well as nursing, discussions with consultants, evaluation of patient's response to treatment, examination of patient, obtaining history from patient or surrogate, ordering and performing treatments and interventions, ordering and review of laboratory studies, ordering and review of radiographic studies, pulse oximetry and re-evaluation of patient's condition.    Labs Reviewed  CBC - Abnormal; Notable for the following:    WBC 41.9 (*)    All other components within normal limits  COMPREHENSIVE METABOLIC PANEL - Abnormal; Notable for the following:    Sodium 131 (*)    Potassium 5.6 (*)    Chloride 95 (*)    CO2 17 (*)    Glucose, Bld  527 (*)    Creatinine, Ser 1.13 (*)    Calcium 8.2 (*)    Albumin 2.9 (*)    AST 366 (*) SLIGHT HEMOLYSIS   ALT 135 (*)    Alkaline Phosphatase 133 (*)    Total Bilirubin 0.2 (*)    GFR calc non Af Amer 68 (*)    GFR calc Af Amer 79 (*)    All other components within normal limits  URINE RAPID DRUG SCREEN (HOSP PERFORMED) - Abnormal; Notable for the following:    Opiates POSITIVE (*)    Benzodiazepines POSITIVE (*)    Tetrahydrocannabinol POSITIVE (*)    All other components within normal limits  URINALYSIS, ROUTINE W REFLEX MICROSCOPIC - Abnormal; Notable for the following:    Glucose, UA 500 (*)    Hgb urine dipstick MODERATE (*)    Protein, ur 30 (*)    All other components within normal limits  BLOOD GAS, ARTERIAL - Abnormal; Notable for the following:    pH,  Arterial 7.096 (*)    pCO2 arterial 47.0 (*)    pO2, Arterial 603.0 (*)    Bicarbonate 13.8 (*)    Acid-base deficit 15.6 (*)    All other components within normal limits  POCT I-STAT, CHEM 8 - Abnormal; Notable for the following:    Sodium 134 (*)    Potassium 5.4 (*)    Glucose, Bld 539 (*)    Calcium, Ion 1.07 (*)    All other components within normal limits  URINE MICROSCOPIC-ADD ON - Abnormal; Notable for the following:    Squamous Epithelial / LPF FEW (*)    Bacteria, UA MANY (*)    All other components within normal limits  ETHANOL  PREGNANCY, URINE  ACETAMINOPHEN LEVEL  POCT CBG MONITORING  I-STAT, CHEM 8  HEMOGLOBIN A1C  BLOOD GAS, ARTERIAL   Ct Head Wo Contrast  07/18/2011  *RADIOLOGY REPORT*  Clinical Data: Altered mental status; unresponsive.  Vomiting. Marked hyperglycemia.  CT HEAD WITHOUT CONTRAST  Technique:  Contiguous axial images were obtained from the base of the skull through the vertex without contrast.  Comparison: CT of the head performed 07/17/2005  Findings: There is no evidence of acute infarction, mass lesion, or intra- or extra-axial hemorrhage on CT.  The posterior fossa,  including the cerebellum, brainstem and fourth ventricle, is within normal limits.  The third and lateral ventricles, and basal ganglia are unremarkable in appearance.  The cerebral hemispheres are symmetric in appearance, with normal gray- white differentiation.  No mass effect or midline shift is seen.  There is no evidence of fracture; visualized osseous structures are unremarkable in appearance.  The orbits are within normal limits. The paranasal sinuses and mastoid air cells are well-aerated.  No significant soft tissue abnormalities are seen.  IMPRESSION: Unremarkable noncontrast CT of the head.  Original Report Authenticated By: Tonia Ghent, M.D.   Dg Chest Portable 1 View  07/18/2011  *RADIOLOGY REPORT*  Clinical Data: Altered mental status, intubation  PORTABLE CHEST - 1 VIEW  Comparison: Portable exam 2256 hours without priors for comparison.  Findings: Tip of endotracheal tube 3.1 cm above carina. Normal heart size, mediastinal contours, and pulmonary vascularity. Lungs clear. No pleural effusion or pneumothorax. Osseous structures unremarkable.  IMPRESSION: Satisfactory endotracheal tube position. No acute abnormalities.  Original Report Authenticated By: Lollie Marrow, M.D.     1. Respiratory failure   2. Opiate overdose   3. Hyperglycemia   4. Metabolic acidosis       MDM  Patient unresponsive on arrival and hypoxic and cyanotic.  Mask with significant improvement in her cyanosis. FSBS greater than 500.  Patient given 2 mg of IV Narcan and began to become more responsive.  She then vomited twice and decision was made to intubate the patient for airway protection as was concerned about possible diabetic ketoacidosis given her blood sugar greater than 500.  There is no prior history of diabetes.  I suspect this was a heroin overdose.  The patient required constant bedside attendance for sedation and repeat evaluations.  At one point the patient developed what appeared to be  decerebrate posturing there was thought to be possible seizure activity.  Patient is given 2 mg IV Ativan with resolution.  Head CT without acute pathology.  Chest x-ray with endotracheal tube in good position.  I discussed the case with the pulmonary critical care who will by with the patient in a minute.  Patient is currently on propofol for sedation.  She's required when  necessary boluses as well as boluses of fentanyl and Versed.  The patient's urine drug screen is positive for narcotics and this was obtained prior to any dosing of opiates in the emergency department.  Patient started on IV fluid boluses and started on IV insulin drip to manage her hyperglycemia.  The patient will be managed aggressively in the intensive care unit. I have updated the family        Lyanne Co, MD 07/19/11 [phone]

## 2011-07-18 NOTE — ED Notes (Signed)
23:35 propofol bolus given 30mg , pt preapared for transport to the CT

## 2011-07-18 NOTE — ED Notes (Signed)
Patient posturing again at this time.  md is present in room.  And informed rn in room to give bolus of prop iv prior to taking the patient to head ct now.

## 2011-07-18 NOTE — ED Notes (Signed)
Decorticate posturing at this time with nonreactive pupil on the left side and very sluggish on the right side.

## 2011-07-18 NOTE — ED Notes (Signed)
Seizure precautions and pads placed on patients bed

## 2011-07-18 NOTE — ED Notes (Signed)
Patient getting ready to go for a head ct at this time.

## 2011-07-18 NOTE — ED Notes (Signed)
Patient having seizure x2 , 2 mg iv ativan being given at this time.

## 2011-07-18 NOTE — ED Notes (Signed)
md is trying to a abg at this time from patient via right fem

## 2011-07-18 NOTE — ED Notes (Signed)
Patient is having a seizure.  md notified.  Respiratory in room.  Tech and rn x 2 in room.  Order to give 2 mg iv ativan

## 2011-07-18 NOTE — ED Notes (Signed)
Patient being intubated at this time.  Patient brought to room and given 2mg  iv narcan and did not arouse.  Patient waking up and vomiting.  Etomidate 20mg  iv 2250.  Etomidate 10mg  iv at 2251.  succ 100 mg iv given at 2251.  Patient has #20 in the Right AC.  Patient intubated at 2252.  7.0 ett 22 at lip.  ett verified by respiratory cxr and co2 detector.  Patient getting foley placed.  Patient blood sugar was 596.

## 2011-07-19 ENCOUNTER — Inpatient Hospital Stay (HOSPITAL_COMMUNITY): Payer: BC Managed Care – PPO

## 2011-07-19 ENCOUNTER — Encounter (HOSPITAL_COMMUNITY): Payer: Self-pay

## 2011-07-19 DIAGNOSIS — R7309 Other abnormal glucose: Secondary | ICD-10-CM

## 2011-07-19 DIAGNOSIS — T50901A Poisoning by unspecified drugs, medicaments and biological substances, accidental (unintentional), initial encounter: Secondary | ICD-10-CM

## 2011-07-19 DIAGNOSIS — T50904A Poisoning by unspecified drugs, medicaments and biological substances, undetermined, initial encounter: Secondary | ICD-10-CM

## 2011-07-19 LAB — URINALYSIS, ROUTINE W REFLEX MICROSCOPIC
Ketones, ur: NEGATIVE mg/dL
Protein, ur: 30 mg/dL — AB
Urobilinogen, UA: 0.2 mg/dL (ref 0.0–1.0)

## 2011-07-19 LAB — URINE MICROSCOPIC-ADD ON

## 2011-07-19 LAB — BLOOD GAS, ARTERIAL
Bicarbonate: 19.5 mEq/L — ABNORMAL LOW (ref 20.0–24.0)
Drawn by: 308601
FIO2: 0.3 %
MECHVT: 320 mL
MECHVT: 270 mL
O2 Saturation: 99.2 %
O2 Saturation: 99.8 %
PEEP: 5 cmH2O
PEEP: 5 cmH2O
Patient temperature: 98.6
RATE: 22 resp/min
RATE: 28 resp/min
TCO2: 17.6 mmol/L (ref 0–100)
pCO2 arterial: 33.8 mmHg — ABNORMAL LOW (ref 35.0–45.0)
pH, Arterial: 7.253 — ABNORMAL LOW (ref 7.350–7.400)
pH, Arterial: 7.394 (ref 7.350–7.400)
pH, Arterial: 7.468 — ABNORMAL HIGH (ref 7.350–7.400)
pO2, Arterial: 150 mmHg — ABNORMAL HIGH (ref 80.0–100.0)
pO2, Arterial: 266 mmHg — ABNORMAL HIGH (ref 80.0–100.0)

## 2011-07-19 LAB — HEMOGLOBIN A1C
Hgb A1c MFr Bld: 4.8 % (ref <5.7)
Mean Plasma Glucose: 91 mg/dL (ref <117)

## 2011-07-19 LAB — COMPREHENSIVE METABOLIC PANEL
ALT: 135 U/L — ABNORMAL HIGH (ref 0–35)
AST: 366 U/L — ABNORMAL HIGH (ref 0–37)
Albumin: 2.9 g/dL — ABNORMAL LOW (ref 3.5–5.2)
CO2: 17 mEq/L — ABNORMAL LOW (ref 19–32)
Chloride: 95 mEq/L — ABNORMAL LOW (ref 96–112)
GFR calc non Af Amer: 68 mL/min — ABNORMAL LOW (ref >90)
Sodium: 131 mEq/L — ABNORMAL LOW (ref 135–145)
Total Bilirubin: 0.2 mg/dL — ABNORMAL LOW (ref 0.3–1.2)

## 2011-07-19 LAB — GLUCOSE, CAPILLARY
Glucose-Capillary: 149 mg/dL — ABNORMAL HIGH (ref 70–99)
Glucose-Capillary: 117 mg/dL — ABNORMAL HIGH (ref 70–99)

## 2011-07-19 LAB — ETHANOL: Alcohol, Ethyl (B): <11 mg/dL (ref 0–11)

## 2011-07-19 LAB — ACETAMINOPHEN LEVEL: Acetaminophen (Tylenol), Serum: <15 ug/mL (ref 10–30)

## 2011-07-19 MED ORDER — SODIUM CHLORIDE 0.9 % IV SOLN
INTRAVENOUS | Status: AC
  Administered 2011-07-19: 01:00:00 via INTRAVENOUS

## 2011-07-19 MED ORDER — PANTOPRAZOLE SODIUM 40 MG IV SOLR
40.0000 mg | INTRAVENOUS | Status: DC
  Administered 2011-07-19: 40 mg via INTRAVENOUS
  Filled 2011-07-19: qty 40

## 2011-07-19 MED ORDER — MIDAZOLAM HCL 2 MG/2ML IJ SOLN
INTRAMUSCULAR | Status: AC
  Administered 2011-07-19: 4 mg via INTRAVENOUS
  Filled 2011-07-19: qty 4

## 2011-07-19 MED ORDER — SODIUM CHLORIDE 0.9 % IV SOLN
250.0000 mL | INTRAVENOUS | Status: DC | PRN
  Administered 2011-07-19: 1000 mL via INTRAVENOUS

## 2011-07-19 MED ORDER — FENTANYL CITRATE 0.05 MG/ML IJ SOLN
INTRAMUSCULAR | Status: AC
Start: 2011-07-19 — End: 2011-07-19
  Administered 2011-07-19: 50 ug via INTRAVENOUS
  Filled 2011-07-19: qty 2

## 2011-07-19 MED ORDER — SODIUM CHLORIDE 0.9 % IV SOLN
1.5000 g | Freq: Four times a day (QID) | INTRAVENOUS | Status: DC
  Administered 2011-07-19 – 2011-07-23 (×15): 1.5 g via INTRAVENOUS
  Filled 2011-07-19 (×20): qty 1.5

## 2011-07-19 MED ORDER — FENTANYL BOLUS VIA INFUSION
50.0000 ug | Freq: Four times a day (QID) | INTRAVENOUS | Status: DC | PRN
  Filled 2011-07-19: qty 100

## 2011-07-19 MED ORDER — SODIUM CHLORIDE 0.9 % IV SOLN
INTRAVENOUS | Status: DC
  Filled 2011-07-19: qty 1

## 2011-07-19 MED ORDER — ACETAMINOPHEN 325 MG PO TABS
650.0000 mg | ORAL_TABLET | Freq: Four times a day (QID) | ORAL | Status: DC | PRN
  Administered 2011-07-19 – 2011-07-21 (×5): 650 mg via ORAL
  Filled 2011-07-19: qty 1
  Filled 2011-07-19 (×4): qty 2

## 2011-07-19 MED ORDER — SODIUM CHLORIDE 0.9 % IV SOLN: INTRAVENOUS | Status: DC

## 2011-07-19 MED ORDER — DEXTROSE-NACL 5-0.45 % IV SOLN: INTRAVENOUS | Status: DC

## 2011-07-19 MED ORDER — SODIUM CHLORIDE 0.9 % IV SOLN
50.0000 ug/h | INTRAVENOUS | Status: DC
  Filled 2011-07-19: qty 50

## 2011-07-19 MED ORDER — ENOXAPARIN SODIUM 40 MG/0.4ML ~~LOC~~ SOLN
40.0000 mg | SUBCUTANEOUS | Status: DC
  Administered 2011-07-19 – 2011-07-23 (×5): 40 mg via SUBCUTANEOUS
  Filled 2011-07-19 (×5): qty 0.4

## 2011-07-19 MED ORDER — PROPOFOL 10 MG/ML IV EMUL
5.0000 ug/kg/min | INTRAVENOUS | Status: DC
  Administered 2011-07-19: 20 ug/kg/min via INTRAVENOUS
  Filled 2011-07-19: qty 100

## 2011-07-19 MED ORDER — MIDAZOLAM HCL 2 MG/2ML IJ SOLN
4.0000 mg | Freq: Once | INTRAMUSCULAR | Status: AC
  Administered 2011-07-19: 4 mg via INTRAVENOUS

## 2011-07-19 MED ORDER — DEXTROSE 50 % IV SOLN: 25.0000 mL | INTRAVENOUS | Status: DC | PRN

## 2011-07-19 MED ORDER — FENTANYL CITRATE 0.05 MG/ML IJ SOLN
100.0000 ug | Freq: Once | INTRAMUSCULAR | Status: AC
  Administered 2011-07-19: 50 ug via INTRAVENOUS

## 2011-07-19 NOTE — Progress Notes (Signed)
eLink Physician-Brief Progress Note Patient Name: Laurie Chaney DOB: [birth year] MRN: [id number]  Date of Service  07/19/2011   HPI/Events of Note  fever   eICU Interventions  Tylenol + blood cx   Intervention Category Minor Interventions: Routine modifications to care plan (e.g. PRN medications for pain, fever)  Laurie Chaney V. 07/19/2011, 4:34 PM

## 2011-07-19 NOTE — ED Notes (Signed)
Per EDP verbal order Campos, pause Propofol

## 2011-07-19 NOTE — ED Notes (Signed)
Pt on the warmer, CT done, family contacted, mother on the way to the ER

## 2011-07-19 NOTE — Progress Notes (Signed)
Pt extubated to 2L nasal cannula after 1 hour of cpap and pressure support 5/5.  Pt breathing well after extubation.

## 2011-07-19 NOTE — Progress Notes (Signed)
Discussed with pt and parents, no evidence dka or even dm.  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

## 2011-07-19 NOTE — Progress Notes (Addendum)
Spoke with Dr Vassie Loll @ [facility].  Pt temp elevated 103.  Pt has no complaints at this time.  Friends at bedside.  New orders given from MD.  Will continue to monitor

## 2011-07-19 NOTE — ED Notes (Signed)
Please note: times are correct in narrative notes

## 2011-07-19 NOTE — Progress Notes (Signed)
0116-Pt arrived from ED intubated with 7.0 ET tube secured at 22 cm at the lip.  Current vent settings are PRVC 400, RR 22, Peep 5 and 40%.  Pt tolerating well at this time, RT to monitor and assess as needed

## 2011-07-19 NOTE — Procedures (Signed)
Arterial Catheter Insertion Procedure Note Laurie Chaney [id number] 1991-07-05  Procedure: Insertion of Arterial Catheter  Indications: Blood pressure monitoring  Procedure Details Consent: Risks of procedure as well as the alternatives and risks of each were explained to the (patient/caregiver).  Consent for procedure obtained. Time Out: Verified patient identification, verified procedure, site/side was marked, verified correct patient position, special equipment/implants available, medications/allergies/relevent history reviewed, required imaging and test results available.  Performed  Maximum sterile technique was used including cap, gloves, gown, hand hygiene, mask and sheet. Skin prep: Chlorhexidine; local anesthetic administered 20 gauge catheter was inserted into left radial artery using the Seldinger technique.  Evaluation Blood flow good; BP tracing good. Complications: No apparent complications.   Berton Bon 07/19/2011

## 2011-07-19 NOTE — Consult Note (Addendum)
Patient name: Laurie Chaney Medical record number: [id number] Date of birth: 04-18-1991 Age: 20 y.o. Gender: female PCP: Lorretta Harp, MD  Date: 07/19/2011 Reason for Consult: drug overdose, hyperglycemia Referring Physician: Azalia Bilis  Brief history This is a 20yo F who was found down with syringe by arm. Intubated in ER. Noted to have glucose in 500s with AG.  Lines/tubes ETT 11/25 >>> Foley 11/25 >>> Radial art 11/25 >>>  Culture data/sepsis markers none  Antibiotics none  Best practice LMWH PPI  Protocols/consults DKA 11/25 >>> cont sedation 11/25 >>>  Events/studies 11/25 NCHCT: unremarkable  HPI: This is a 20yo F who friends found down at home with syringe by arm. She was brought to ER where she was found to be cyanotic, HYPOthermic, and low RR. Given narcan with arousal but emesis, then intubated for airway protection. Questionable decerebrate posturing s/p ativan with resolution, NCHCT obtained that was unremarkable. Utox + for opiates (prior to fentanyl in ER), benzos (rx'ed klonopin at home), and THC. Initial glucose in 500s with AG 18 so started on insuling gtt. Currently hemodynamically stable and sedated on propofol. I spoke with both parents who did not have any other relevant history to provide outside of seeing her at [employer] earlier in the day and that she appeared in her usual state.  ROS: unable to assess secondary to sedation  Past Medical History  Diagnosis Date  . Acne   . Migraine   . Anxiety   . Seizures     had 6 none since 4th grade  . Depression     hx of wellbutrin and lexapro use  . History of recurrent UTIs     as a child had evaluation  . History of varicella     Past Surgical History  Procedure Date  . Tympanostomy tube placement     age 76-4    Family History  Problem Relation Age of Onset  . Hypertension Mother   . Depression Mother   . Anxiety disorder Mother     Social History: unable to assess secondary to  sedation  Allergies: No Known Allergies  Medications:  Prior to Admission medications   Medication Sig Start Date End Date Taking? Authorizing Provider  clonazePAM (KLONOPIN) 1 MG tablet Take 1 mg by mouth 3 (three) times daily as needed.      Historical Provider, MD  ISOtretinoin (ACCUTANE) 40 MG capsule Take 40 mg by mouth 2 (two) times daily.      Historical Provider, MD    Temp:  [94.3 F (34.6 C)-98.1 F (36.7 C)] 98.1 F (36.7 C) (11/25 0030) Pulse Rate:  [85-110] 100  (11/25 0030) Resp:  [16-32] 22  (11/25 0030) BP: (108-154)/(68-127) 108/68 mmHg (11/25 0030) SpO2:  [81 %-100 %] 100 % (11/25 0030) FiO2 (%):  [40 %-100 %] 40 % (11/25 0037) Weight:  [45.36 kg (100 lb)] 100 lb (45.36 kg) (11/24 2307)    PRVC RR 22 Vt 400cc PEEP 5 FiO2 0.40  gen sedated ENT ETT 22cm @ incisors Neck no masses or JVD Lymph no cervical or supraclav LAD CV RRR, no murmur pulm CTAB without wheezes or crackles abd soft and non-distended, min BS Ext needle marks in antecub bilat (unclear if this is from ER or prior to arrival) Skin multiple tattoos  Lab Results  Component Value Date   CREATININE 1.10 07/18/2011   BUN 8 07/18/2011   NA 134* 07/18/2011   K 5.4* 07/18/2011   CL 100 07/18/2011   CO2  17* 07/18/2011   Lab Results  Component Value Date   WBC 41.9* 07/18/2011   HGB 15.0 07/18/2011   HCT 44.0 07/18/2011   MCV 93.8 07/18/2011   PLT 232 07/18/2011   Lab Results  Component Value Date   ALT 135* 07/18/2011   AST 366* 07/18/2011   ALKPHOS 133* 07/18/2011   BILITOT 0.2* 07/18/2011   CXR: ETT appropriate, otherwise unremarkable NCHCT: unremarkable  Assessment and Plan  This is a 20yo F with likley opiate overdose and DKA.  1. opiate overdose -- will need psych when awake -- ETT for airway protection for now -- EKG to make sure no TCAs -- cont sedation with propofol  2. DKA -- no prior history of DM -- insulin gtt, etc. -- u/a to confirm ketones -- art line  for frequent labs  3.  AKI likely volume depletion -- resolved on recheck  4. Leukocytosis -- likley reactive -- recheck CBC in am  5. Best Practices -- LMWH -- PPI  FULL CODE  The patient is critically ill with multiple organ systems failure and requires high complexity decision making for assessment and support, frequent evaluation and titration of therapies, application of advanced monitoring technologies and extensive interpretation of multiple databases.  Total critical care time excluding procedures:  Joie Bimler MD, MPH  Laurie Chaney 07/19/2011, 1:16 AM

## 2011-07-19 NOTE — Progress Notes (Signed)
eLink Physician-Brief Progress Note Patient Name: Laurie Chaney DOB: [birth year] MRN: [id number]  Date of Service  07/19/2011   HPI/Events of Note   High grade fever & WC 42k   eICU Interventions  Blood cx drawn, Get pCXR Start empiric unasyn for aspiration   Intervention Category Intermediate Interventions: Other:  Laurie Chaney V. 07/19/2011, 9:39 PM

## 2011-07-19 NOTE — Progress Notes (Signed)
eLink Physician-Brief Progress Note Patient Name: JONI COLEGROVE DOB: July 04, 1991 MRN: [id number]  Date of Service  07/19/2011   HPI/Events of Note     eICU Interventions  Sitter for suicide precautions   Intervention Category Minor Interventions: Routine modifications to care plan (e.g. PRN medications for pain, fever)  Andros Channing V. 07/19/2011, 5:43 PM

## 2011-07-19 NOTE — Progress Notes (Signed)
ANTIBIOTIC CONSULT NOTE - INITIAL  Pharmacy Consult for Unasyn Indication: Aspiration pneumonia  No Known Allergies  Patient Measurements: Height: 5' (152.4 cm) Weight: 104 lb 11.5 oz (47.5 kg) IBW/kg (Calculated) : 45.5  Adjusted Body Weight:   Vital Signs: Temp: 103.8 F (39.9 C) (11/25 2000) Temp src: Core (Comment) (11/25 2000) BP: 104/51 mmHg (11/25 1700) Pulse Rate: 122  (11/25 1700) Intake/Output from previous day: 11/24 0701 - 11/25 0700 In: -  Out: 800 [Urine:800] Intake/Output from this shift: Total I/O In: -  Out: 150 [Urine:150]  Labs:  [facility] 07/18/11 2327 07/18/11 2315  WBC -- 41.9*  HGB 15.0 12.7  PLT -- 232  LABCREA -- --  CREATININE 1.10 1.13*   Estimated Creatinine Clearance: 57.6 ml/min (by C-G formula based on Cr of 1.1). No results found for this basename: VANCOTROUGH:2,VANCOPEAK:2,VANCORANDOM:2,GENTTROUGH:2,GENTPEAK:2,GENTRANDOM:2,TOBRATROUGH:2,TOBRAPEAK:2,TOBRARND:2,AMIKACINPEAK:2,AMIKACINTROU:2,AMIKACIN:2, in the last 72 hours   Microbiology: Recent Results (from the past 720 hour(s))  MRSA PCR SCREENING     Status: Normal   Collection Time   07/19/11  2:20 AM      Component Value Range Status Comment   MRSA by PCR NEGATIVE  NEGATIVE  Final     Medical History: Past Medical History  Diagnosis Date  . Acne   . Migraine   . Anxiety   . Seizures     had 6 none since 4th grade  . Depression     hx of wellbutrin and lexapro use  . History of recurrent UTIs     as a child had evaluation  . History of varicella     Medications:  Scheduled:    . enoxaparin  40 mg Subcutaneous Q24H  . fentaNYL  100 mcg Intravenous Once  . LORazepam      . midazolam  4 mg Intravenous Once  . naloxone (NARCAN) injection  2 mg Intravenous Once  . ondansetron (ZOFRAN) IV  4 mg Intravenous Once  . propofol  40 mg Intravenous Once  . propofol  5-70 mcg/kg/min Intravenous Once  . sodium chloride  1,000 mL Intravenous Once  . DISCONTD: pantoprazole  (PROTONIX) IV  40 mg Intravenous Q24H   Assessment: 20 yo female admitted for drug overdose and hyperglycemia who was intubated in the ER.  Beginning broad spectrum antibiotics with Unasyn for fever, leukocytosis, possible aspiration.  Goal of Therapy:  Eradication of infection  Plan:  -Unasyn 1.5gm IV q6h for CrCl >50 ml/min -Follow renal function & cultures  Rollene Fare 07/19/2011,9:47 PM

## 2011-07-19 NOTE — Progress Notes (Signed)
0426-Pt VT decreased to 6cc/kg per ARDS protocol.  ABG in 30 mins.  RT to monitor and assess as needed

## 2011-07-20 DIAGNOSIS — J690 Pneumonitis due to inhalation of food and vomit: Secondary | ICD-10-CM

## 2011-07-20 DIAGNOSIS — J9600 Acute respiratory failure, unspecified whether with hypoxia or hypercapnia: Secondary | ICD-10-CM

## 2011-07-20 MED ORDER — DEXTROMETHORPHAN POLISTIREX 30 MG/5ML PO LQCR
30.0000 mg | Freq: Three times a day (TID) | ORAL | Status: DC | PRN
  Administered 2011-07-20 – 2011-07-23 (×8): 30 mg via ORAL
  Filled 2011-07-20 (×8): qty 5

## 2011-07-20 NOTE — Progress Notes (Signed)
Dr Rosezetta Schlatter made aware of patient temp 104.0  Pt given tylenol at 11 30 pm.  No new orders given at this time.  Will repeat temp

## 2011-07-20 NOTE — Progress Notes (Signed)
Patient name: Laurie Chaney Medical record number: [id number] Date of birth: [birth year] Age: 20 y.o. Gender: female PCP: Lorretta Harp, MD  Date: 07/20/2011 Reason for Consult: drug overdose, hyperglycemia Referring Physician: Azalia Bilis  Brief history This is a 20yo F who was found down with syringe by arm pm 11/24 and Intubated in ER with dx heroin overdose.  Lines/tubes ETT 11/25 >>> 11/26  Radial art 11/25 >>> 11/26  Culture data/sepsis markers BC x 2 11/25 >>> MRSA screen 11/25 >  neg    Antibiotics Unasyn (asp) 11/25 >>  Best practice LMWH    Protocols/consults    Events/studies 11/25 NCHCT: unremarkable  Overnight:  Spiked to 104 but no distress   Temp:  [100.2 F (37.9 C)-104 F (40 C)] 100.2 F (37.9 C) (11/26 1200) Pulse Rate:  [105-122] 109  (11/26 1200) Resp:  [20-47] 32  (11/26 1200) BP: (92-114)/(44-71) 100/61 mmHg (11/26 1200) SpO2:  [93 %-100 %] 97 % (11/26 1200) Arterial Line BP: (131-144)/(68-73) 131/68 mmHg (11/25 1500) FiO2 (%):  [30.1 %-30.4 %] 30.4 % (11/25 1440) Weight:  [101 lb 3.1 oz (45.9 kg)] 101 lb 3.1 oz (45.9 kg) (11/26 0400)      Young wf nad Neck no masses or JVD Lymph no cervical or supraclav LAD CV RRR, no murmur pulm CTAB without wheezes or crackles abd soft and non-distended, min BS Ext needle marks in antecub bilat (unclear if this is from ER or prior to arrival) Skin multiple tattoos  pcxr 11/25 Potential developing right lower lung infiltrate.   Assessment and Plan  This is a 20yo F with likley opiate overdose    1. opiate overdose -- psych eval:  Needs inpt eval     2. Prob asp pna Unasyn per dashboard      3 Best Practices -- LMWH     Discussed with father again am 11/26  Sandrea Hughs, MD Pulmonary and Critical Care Medicine [facility] Cell [phone]

## 2011-07-20 NOTE — Progress Notes (Signed)
Ur review completed  

## 2011-07-20 NOTE — Plan of Care (Signed)
Problem: Phase II Progression Outcomes Goal: Time pt extubated/weaned off vent Outcome: Completed/Met Date Met:  07/20/11 Extubated yesterday-

## 2011-07-20 NOTE — Consult Note (Signed)
Patient Identification:  Laurie Chaney Date of Evaluation:  07/20/2011   History of Present Illness:  20 year old Caucasian female with history of  anxiety disorder followed by Dr. Evelene Croon in the outpatient setting. Patient is admitted in ICU ofter she used heroin in the IV form. She denies history of abuse of opioids and used this as a first-time as per her for fun. Patient is living with the roommate who abuses drugs. But after discharge from the hospital her plan is to stay with  Parents. When i asked her that whether it was asuicide attempt she told me she loves her life she knows life goals and she was not trying to kill herself. She denied any past psych hospitalization except for one incident when she was in the school she was admitted for anxiety. She denied any history of suicide attempt in the past. Patient has a history of cutting and as per family violent sexual behaviors. Patient's family is very concerned about patient's drug abuse which is marijuana they also suspected that she is abusing other drugs in spite of patient telling me that she used heroin for the first time.   Past Medical History:     Past Medical History  Diagnosis Date  . Acne   . Migraine   . Anxiety   . Seizures     had 6 none since 4th grade  . Depression     hx of wellbutrin and lexapro use  . History of recurrent UTIs     as a child had evaluation  . History of varicella        Past Surgical History  Procedure Date  . Tympanostomy tube placement     age 60-4    Allergies: No Known Allergies  Current Medications:  Prior to Admission medications   Medication Sig Start Date End Date Taking? Authorizing Provider  buPROPion (WELLBUTRIN XL) 150 MG 24 hr tablet Take 450 mg by mouth daily.      Historical Provider, MD  clonazePAM (KLONOPIN) 1 MG tablet Take 1 mg by mouth 3 (three) times daily as needed.     Historical Provider, MD  ISOtretinoin (ACCUTANE) 40 MG capsule Take 40 mg by mouth 2 (two)  times daily.     Historical Provider, MD  lamoTRIgine (LAMICTAL) 200 MG tablet Take 200 mg by mouth 2 (two) times daily.      Historical Provider, MD  lisdexamfetamine (VYVANSE) 60 MG capsule Take 60 mg by mouth every morning.      Historical Provider, MD  QUEtiapine (SEROQUEL) 25 MG tablet Take 25-75 mg by mouth at bedtime.      Historical Provider, MD  zolpidem (AMBIEN) 10 MG tablet Take 10 mg by mouth at bedtime as needed. For sleep     Historical Provider, MD    Social History:    reports that she has never smoked. She does not have any smokeless tobacco history on file. She reports that she drinks alcohol. She reports that she does not use illicit drugs.   Family History:    Family History  Problem Relation Age of Onset  . Hypertension Mother   . Depression Mother   . Anxiety disorder Mother     Mental Status Examination/Evaluation: Objective:  Appearance: Fairly Groomed  Psychomotor Activity:  Normal  Eye Contact::  Fair  Speech:  Normal Rate  Volume:  Normal  Mood:  Anxious   Affect:  Congruent  Thought Process:  Logical and goal-directed   Orientation:  Full  Thought Content:  Not hallucinating or delusional   Suicidal Thoughts:  No  Homicidal Thoughts:  No  Judgement:  Impaired  Insight:  Shallow    DIAGNOSIS:   AXIS I  anxiety disorder NOS, heroine abuse, marijuana dependence   AXIS II  Deffered  AXIS III See medical notes.  AXIS IV  substance abuse   AXIS V 41-50 serious symptoms     Recommendations: Because of the severity of drug abuse as patient was admitted in ICU and past history of cutting herself patient meets criteria to be admitted in the inpatient facility for further stabilization. Once medically cleared she can be transferred to behavioral health.    Eulogio Ditch, MD

## 2011-07-20 NOTE — Progress Notes (Signed)
CSW met with Pt due to accidental heroin overdose. Full assessment in shadow chart. Pt states this was her first use, but admits to frequent marijuana use to help with her anxiety issues. Pt struggles with anxiety and depression, currently on probation for giving a friend her anxiety meds. Pt denies suicidal ideation. CSW met with psychiatrist and will help execute his plan. He feels Pt. needs inpt. detox at [facility]. CSW will follow and assist with executing plan.  Vennie Homans, [place] 07/20/2011 1:27 PM [phone]

## 2011-07-21 ENCOUNTER — Inpatient Hospital Stay (HOSPITAL_COMMUNITY): Payer: BC Managed Care – PPO

## 2011-07-21 DIAGNOSIS — J690 Pneumonitis due to inhalation of food and vomit: Secondary | ICD-10-CM

## 2011-07-21 DIAGNOSIS — J9600 Acute respiratory failure, unspecified whether with hypoxia or hypercapnia: Secondary | ICD-10-CM

## 2011-07-21 MED ORDER — ENSURE CLINICAL ST REVIGOR PO LIQD
237.0000 mL | Freq: Two times a day (BID) | ORAL | Status: DC
  Administered 2011-07-21 – 2011-07-22 (×2): 237 mL via ORAL

## 2011-07-21 MED ORDER — PHENOL 1.4 % MT LIQD
1.0000 | OROMUCOSAL | Status: DC | PRN
Start: 2011-07-21 — End: 2011-07-23
  Administered 2011-07-21 – 2011-07-22 (×6): 1 via OROMUCOSAL
  Filled 2011-07-21: qty 177

## 2011-07-21 NOTE — Progress Notes (Signed)
PCP: Lorretta Harp, MD  Date: 07/21/2011 Reason for Consult: drug overdose, hyperglycemia Referring Physician: Azalia Bilis  Brief history   20yo F who was found down with syringe by arm pm 11/24 and Intubated in ER with dx heroin overdose.  Lines/tubes ETT 11/25 >>> 11/26  Radial art 11/25 >>> 11/26  Culture data/sepsis markers BC x 2 11/25 >>>ngtd>> MRSA screen 11/25 >  neg    Antibiotics Unasyn (asp) 11/25 >>  Best practice LMWH    Protocols/consults 11/26 Shamsher Alvie Heidelberg, MD      Events/studies   Overnight:  Spiked to 102.7 but no distress   Temp:  [98.8 F (37.1 C)-102.7 F (39.3 C)] 99.3 F (37.4 C) (11/27 0646) Pulse Rate:  [95-111] 106  (11/27 0646) Resp:  [20-35] 20  (11/27 0646) BP: (100-113)/(58-68) 104/68 mmHg (11/27 0646) SpO2:  [97 %-99 %] 97 % (11/27 0646)      Young wf nad Neck no masses or JVD Lymph no cervical or supraclav LAD CV RRR, no murmur pulm CTAB without wheezes or crackles abd soft and non-distended, min BS Ext needle marks in antecub bilat (unclear if this is from ER or prior to arrival) Skin multiple tattoos  pcxr 11/25 Potential developing right lower lung infiltrate.   Assessment and Plan  This is a 20yo F with likley opiate overdose    1. opiate overdose -- psych eval:  Needs BHC when med cleared  `11/27 fever therefore not ready    2. Prob asp pna Unasyn per dashboard -11/27 recheck c x r       3 Best Practices -- LMWH       Sandrea Hughs, MD Pulmonary and Critical Care Medicine [facility] Cell [phone]

## 2011-07-21 NOTE — Progress Notes (Signed)
Report given to [place] on [address]. Pt transported to 1314 by sitter now.

## 2011-07-21 NOTE — Progress Notes (Signed)
INITIAL ADULT NUTRITION ASSESSMENT Date: 07/21/2011   Time: 10:28 AM Reason for Assessment: Health history  ASSESSMENT: Female 20 y.o.  Dx: Overdose  Hx:  Past Medical History  Diagnosis Date  . Acne   . Migraine   . Anxiety   . Seizures     had 6 none since 4th grade  . Depression     hx of wellbutrin and lexapro use  . History of recurrent UTIs     as a child had evaluation  . History of varicella    Related Meds:  Scheduled Meds:   . ampicillin-sulbactam (UNASYN) IV  1.5 g Intravenous Q6H  . enoxaparin  40 mg Subcutaneous Q24H   Continuous Infusions:  PRN Meds:.acetaminophen, dextromethorphan, phenol  Ht: 5' (152.4 cm)  Wt: 101 lb 3.1 oz (45.9 kg)  Ideal Wt: 45.5 kg  % Ideal Wt: 100  Usual Wt: 45kg % Usual Wt: 102  Body mass index is 19.76 kg/(m^2).  Food/Nutrition Related Hx: Pt asleep during visit, mother present at bedside. Mother reports pt has never been a big eater her whole life, does not like meats or vegetables, but has has a stable weight for the past few years. Mother reports pt not eating well currently r/t PNA and coughing. Mother states pt has sore throat from intubation on 11/24, pt extubated 11/25.   Labs:  CMP     Component Value Date/Time   NA 134* 07/18/2011 2327   K 5.4* 07/18/2011 2327   CL 100 07/18/2011 2327   CO2 17* 07/18/2011 2315   GLUCOSE 539* 07/18/2011 2327   BUN 8 07/18/2011 2327   CREATININE 1.10 07/18/2011 2327   CALCIUM 8.2* 07/18/2011 2315   PROT 6.3 07/18/2011 2315   ALBUMIN 2.9* 07/18/2011 2315   AST 366* 07/18/2011 2315   ALT 135* 07/18/2011 2315   ALKPHOS 133* 07/18/2011 2315   BILITOT 0.2* 07/18/2011 2315   GFRNONAA 68* 07/18/2011 2315   GFRAA 79* 07/18/2011 2315   CBG (last 3)   Basename 07/19/11 0140 07/19/11 0106  GLUCAP 117* 149*    Intake/Output Summary (Last 24 hours) at 07/21/11 1033 Last data filed at 07/21/11 0500  Gross per 24 hour  Intake     55 ml  Output   1920 ml  Net  -1865 ml     Diet Order: General  Estimated Nutritional Needs:   Kcal:1800-2000 Protein:55-70g Fluid:1.8-2L  NUTRITION DIAGNOSIS: -Inadequate oral intake (NI-2.1).  Status: Ongoing -Pt meets criteria for moderate malnutrition of social circumstances AEB thin frame with minimal muscle and body fat and mother's report of pt with typical minimal food intake per pt preference  RELATED TO: coughing, PNA, sore throat, small eater  AS EVIDENCE BY: mother's statement/poor intake  MONITORING/EVALUATION(Goals): Pt to consume >50% of meals/supplements.   EDUCATION NEEDS: -No education needs identified at this time  INTERVENTION: Chocolate Ensure Clinical Strength BID. Will order snacks for pt. Encouraged increase intake. Chloraseptic spray ordered through nurse practitioner to help with sore throat. Will monitor.   Dietitian # [phone]  DOCUMENTATION CODES Per approved criteria  -Non-severe (moderate) malnutrition in the context of social circumstances    Marshall Cork 07/21/2011, 10:28 AM

## 2011-07-21 NOTE — Progress Notes (Signed)
CSW following for transfer to [facility] when cleared. Pt is agreeable to this plan and would like to go voluntarily. Documentation does not state plan for IVC. This will need to be clarified. CSW to continue to follow.  Vennie Homans, [place] 07/21/2011 12:43 PM #[phone]

## 2011-07-22 LAB — CBC
HCT: 34.4 % — ABNORMAL LOW (ref 36.0–46.0)
Hemoglobin: 11.3 g/dL — ABNORMAL LOW (ref 12.0–15.0)
MCHC: 32.8 g/dL (ref 30.0–36.0)
RBC: 4.01 MIL/uL (ref 3.87–5.11)
WBC: 5.3 10*3/uL (ref 4.0–10.5)

## 2011-07-22 LAB — BASIC METABOLIC PANEL
Chloride: 102 mEq/L (ref 96–112)
GFR calc non Af Amer: >90 mL/min (ref >90)
Glucose, Bld: 95 mg/dL (ref 70–99)
Potassium: 3.2 mEq/L — ABNORMAL LOW (ref 3.5–5.1)
Sodium: 135 mEq/L (ref 135–145)

## 2011-07-22 NOTE — Progress Notes (Signed)
PCP: Lorretta Harp, MD  Date: 07/22/2011 Reason for Consult: drug overdose, hyperglycemia Referring Physician: Azalia Bilis  Brief history   20yo F who was found down with syringe by arm pm 11/24 and Intubated in ER with dx heroin overdose.  Lines/tubes ETT 11/25 >>> 11/26  Radial art 11/25 >>> 11/26  Culture data/sepsis markers BC x 2 11/25 >>>ngtd>> MRSA screen 11/25 >  neg    Antibiotics Unasyn (asp) 11/25 >>  Best practice LMWH    Protocols/consults 11/26 Katheren Puller, MD      Events/studies   Overnight:   Much better, all smiles on RA   Temp:  [98.9 F (37.2 C)-99.6 F (37.6 C)] 98.9 F (37.2 C) (11/28 1420) Pulse Rate:  [83-108] 108  (11/28 1420) Resp:  [20] 20  (11/28 1420) BP: (101-104)/(67-72) 101/67 mmHg (11/28 1420) SpO2:  [99 %-100 %] 100 % (11/28 1420)      Young wf nad Neck no masses or JVD Lymph no cervical or supraclav LAD CV RRR, no murmur pulm CTAB without wheezes or crackles abd soft and non-distended, min BS Ext needle marks in antecub bilat (unclear if this is from ER or prior to arrival) Skin multiple tattoos  cxr 11/28 Improved left lower lobe opacity, question improved infiltrate    Assessment and Plan  This is a 20yo F with likley opiate overdose    1. opiate overdose -- psych eval:  Needs BHC when med cleared  `11/27 fever therefore not ready    2. Prob asp pna Unasyn per dashboard -much better so change to augmentin 11/29 and discharge to behavioral health      3 Best Practices -- LMWH       Sandrea Hughs, MD Pulmonary and Critical Care Medicine [facility] Cell [phone]

## 2011-07-22 NOTE — Progress Notes (Signed)
ANTIBIOTIC CONSULT NOTE - FOLLOW-UP  Pharmacy Consult for Unasyn Indication: Aspiration pneumonia  No Known Allergies  Patient Measurements: Height: 5' (152.4 cm) Weight: 101 lb 3.1 oz (45.9 kg) IBW/kg (Calculated) : 45.5    Vital Signs: Temp: 99.6 F (37.6 C) (11/28 0624) Temp src: Oral (11/28 0624) BP: 104/72 mmHg (11/28 0624) Pulse Rate: 96  (11/28 0624) Intake/Output from previous day: 11/27 0701 - 11/28 0700 In: 427 [IV Piggyback:427] Out: 1380 [Urine:1380] Intake/Output from this shift:    Labs:  [facility] 07/22/11 0417  WBC 5.3  HGB 11.3*  PLT 159  LABCREA --  CREATININE 0.59   Estimated Creatinine Clearance: 79.2 ml/min (by C-G formula based on Cr of 0.59). No results found for this basename: VANCOTROUGH:2,VANCOPEAK:2,VANCORANDOM:2,GENTTROUGH:2,GENTPEAK:2,GENTRANDOM:2,TOBRATROUGH:2,TOBRAPEAK:2,TOBRARND:2,AMIKACINPEAK:2,AMIKACINTROU:2,AMIKACIN:2, in the last 72 hours   Microbiology: Recent Results (from the past 720 hour(s))  MRSA PCR SCREENING     Status: Normal   Collection Time   07/19/11  2:20 AM      Component Value Range Status Comment   MRSA by PCR NEGATIVE  NEGATIVE  Final   CULTURE, BLOOD (ROUTINE X 2)     Status: Normal (Preliminary result)   Collection Time   07/19/11  5:10 PM      Component Value Range Status Comment   Specimen Description BLOOD LEFT ARM   Final    Special Requests     Final    Value: BOTTLES DRAWN AEROBIC AND ANAEROBIC 5CC BOTH BOTTLES   Setup Time [id number]   Final    Culture     Final    Value:        BLOOD CULTURE RECEIVED NO GROWTH TO DATE CULTURE WILL BE HELD FOR 5 DAYS BEFORE ISSUING A FINAL NEGATIVE REPORT   Report Status PENDING   Incomplete   CULTURE, BLOOD (ROUTINE X 2)     Status: Normal (Preliminary result)   Collection Time   07/19/11  5:15 PM      Component Value Range Status Comment   Specimen Description BLOOD LEFT ARM   Final    Special Requests     Final    Value: BOTTLES DRAWN AEROBIC AND ANAEROBIC  5CC BOTH BOTTLES   Setup Time [id number]   Final    Culture     Final    Value:        BLOOD CULTURE RECEIVED NO GROWTH TO DATE CULTURE WILL BE HELD FOR 5 DAYS BEFORE ISSUING A FINAL NEGATIVE REPORT   Report Status PENDING   Incomplete     Medical History: Past Medical History  Diagnosis Date  . Acne   . Migraine   . Anxiety   . Seizures     had 6 none since 4th grade  . Depression     hx of wellbutrin and lexapro use  . History of recurrent UTIs     as a child had evaluation  . History of varicella     Medications:  Scheduled:     . ampicillin-sulbactam (UNASYN) IV  1.5 g Intravenous Q6H  . enoxaparin  40 mg Subcutaneous Q24H  . feeding supplement  237 mL Oral BID BM   Assessment: Day # 4 Unasyn for aspiration pneumonia. Patient finally without a fever spike yesterday. WBC and SCr stable  Goal of Therapy:  Eradication of infection  Plan:  -Continue Unasyn 1.5g IV q6 -Recommend change to PO antibiotic when possible - Augmentin 875mg  PO BID -Follow renal function & cultures  Berkley Harvey 07/22/2011,8:03 AM

## 2011-07-22 NOTE — Progress Notes (Signed)
CSW following for substance abuse issues. Per psych, Pt can follow up at [facility] as an outpt. Pt also has own psychiatrist in community. Pt aware of plan and agrees. This CSW signing off.  Vennie Homans, [place] 07/22/2011 1:30 PM [phone]

## 2011-07-22 NOTE — Progress Notes (Signed)
  Laurie Chaney is a 20 y.o. female [id number] 12-11-1990  Subjective/Objective:  Patient is doing much better she don't have any withdrawal symptoms. She is logical and goal directed not having any suicidal or homicidal ideations. She is pleasant on approach I discussed with her again about different options at this time patient will get more benefit by going going to intensive outpatient program  as behavioral health does not have inpatient rehabilitation patient can be either sent to inpatient rehabilitation or to intensive outpatient program. I told about this to the social worker Mindi Junker  Assessment/Plan: Patient can be discharged to followup in the inpatient rehabilitation or in the intensive outpatient program.  Filed Vitals:   07/22/11 0624  BP: 104/72  Pulse: 96  Temp: 99.6 F (37.6 C)  Resp: 22    Lab Results:   BMET    Component Value Date/Time   NA 135 07/22/2011 0417   K 3.2* 07/22/2011 0417   CL 102 07/22/2011 0417   CO2 23 07/22/2011 0417   GLUCOSE 95 07/22/2011 0417   BUN 6 07/22/2011 0417   CREATININE 0.59 07/22/2011 0417   CALCIUM 8.1* 07/22/2011 0417   GFRNONAA >90 07/22/2011 0417   GFRAA >90 07/22/2011 0417    Medications:  Scheduled:     . ampicillin-sulbactam (UNASYN) IV  1.5 g Intravenous Q6H  . enoxaparin  40 mg Subcutaneous Q24H  . feeding supplement  237 mL Oral BID BM     PRN Meds acetaminophen, dextromethorphan, phenol   Asiah Befort S MD 07/22/2011

## 2011-07-23 ENCOUNTER — Encounter (HOSPITAL_COMMUNITY): Payer: Self-pay | Admitting: Acute Care

## 2011-07-23 DIAGNOSIS — J690 Pneumonitis due to inhalation of food and vomit: Secondary | ICD-10-CM

## 2011-07-23 DIAGNOSIS — T401X4A Poisoning by heroin, undetermined, initial encounter: Secondary | ICD-10-CM

## 2011-07-23 DIAGNOSIS — T50901A Poisoning by unspecified drugs, medicaments and biological substances, accidental (unintentional), initial encounter: Secondary | ICD-10-CM | POA: Diagnosis present

## 2011-07-23 DIAGNOSIS — J9600 Acute respiratory failure, unspecified whether with hypoxia or hypercapnia: Secondary | ICD-10-CM

## 2011-07-23 HISTORY — DX: Poisoning by unspecified drugs, medicaments and biological substances, accidental (unintentional), initial encounter: T50.901A

## 2011-07-23 HISTORY — DX: Acute respiratory failure, unspecified whether with hypoxia or hypercapnia: J96.00

## 2011-07-23 MED ORDER — GUAIFENESIN ER 600 MG PO TB12
600.0000 mg | ORAL_TABLET | Freq: Two times a day (BID) | ORAL | Status: DC | PRN
  Administered 2011-07-23: 600 mg via ORAL
  Filled 2011-07-23 (×2): qty 1

## 2011-07-23 MED ORDER — AMOXICILLIN-POT CLAVULANATE 875-125 MG PO TABS: 1.0000 | ORAL_TABLET | Freq: Two times a day (BID) | ORAL | Status: AC

## 2011-07-23 MED ORDER — ACETAMINOPHEN 325 MG PO TABS: 650.0000 mg | ORAL_TABLET | Freq: Four times a day (QID) | ORAL | Status: AC | PRN

## 2011-07-23 NOTE — Progress Notes (Signed)
  Laurie Chaney is a 20 y.o. female [id number] February 20, 1991  Subjective/Objective:  Patient is doing very well she is calm cooperative and pleasant on approach. She is logical and goal-directed not suicidal or homicidal. Psychoeducation given to the patient regarding the intensive outpatient program. I also spoke with patient's father and explained him about intensive outpatient program he is fully agreeable with this plan at this time. Assessment/Plan:  Sitter discontinued patient is cleared from psychiatry for discharge I will speak to the social worker to arrange for the patient to go to the intensive outpatient program.  Filed Vitals:   07/23/11 1300  BP: 99/64  Pulse: 91  Temp: 97.8 F (36.6 C)  Resp: 20    Lab Results:   BMET    Component Value Date/Time   NA 135 07/22/2011 0417   K 3.2* 07/22/2011 0417   CL 102 07/22/2011 0417   CO2 23 07/22/2011 0417   GLUCOSE 95 07/22/2011 0417   BUN 6 07/22/2011 0417   CREATININE 0.59 07/22/2011 0417   CALCIUM 8.1* 07/22/2011 0417   GFRNONAA >90 07/22/2011 0417   GFRAA >90 07/22/2011 0417    Medications:  Scheduled:     . DISCONTD: ampicillin-sulbactam (UNASYN) IV  1.5 g Intravenous Q6H  . DISCONTD: enoxaparin  40 mg Subcutaneous Q24H  . DISCONTD: feeding supplement  237 mL Oral BID BM     PRN Meds acetaminophen, guaiFENesin, DISCONTD: dextromethorphan, DISCONTD: phenol   AHLUWALIA,SHAMSHER S MD 07/23/2011

## 2011-07-23 NOTE — Discharge Summary (Signed)
Pt seen and examined and database reviewed. I agree with above findings, assessment and plan  David Simonds, MD;  PCCM service; Mobile [phone]  

## 2011-07-23 NOTE — Progress Notes (Signed)
Pt has non-productive, congested cough that has continued to worsened. Respiratory Therapy called to assess her and no treatment suggestions suggested that will help with the congestion aside from already ordered mucinex. Said Tussinex might offer some relief. Eugene Garnet [facility] 07/23/2011 7:42 AM

## 2011-07-23 NOTE — Progress Notes (Signed)
Patient d/c with family,d/c instructions given , verbalized understanding, no c/o pain, stable,in good spirit.,prescriptions given

## 2011-07-23 NOTE — Progress Notes (Signed)
PCP: Lorretta Harp, MD  Date: 07/23/2011 Reason for Consult: drug overdose, hyperglycemia Referring Physician: Azalia Bilis  Brief history   20yo F who was found down with syringe by arm pm 11/24 and Intubated in ER with dx heroin overdose.  Lines/tubes ETT 11/25 >>> 11/26  Radial art 11/25 >>> 11/26  Culture data/sepsis markers BC x 2 11/25 >>>ngtd>> MRSA screen 11/25 >  neg   Antibiotics Unasyn (asp) 11/25 >>  Best practice LMWH   Protocols/consults 11/26 Katheren Puller, MD    Events/studies   Overnight:   No new findings  Temp:  [97.8 F (36.6 C)-98.9 F (37.2 C)] 97.8 F (36.6 C) (11/29 1300) Pulse Rate:  [77-108] 91  (11/29 1300) Resp:  [20] 20  (11/29 1300) BP: (99-115)/(64-73) 99/64 mmHg (11/29 1300) SpO2:  [98 %-100 %] 98 % (11/29 1300)     Young wf nad Neck no masses or JVD Lymph no cervical or supraclav LAD CV RRR, no murmur pulm CTAB without wheezes or crackles abd soft and non-distended, min BS Ext needle marks in antecub bilat (unclear if this is from ER or prior to arrival) Skin multiple tattoos  cxr 11/28 Improved left lower lobe opacity, question improved infiltrate    Assessment and Plan  This is a 20yo F with likley opiate overdose    1. opiate overdose -[facility] in out pt setting.    2. Prob asp pna augmentin will complete total 10 d rx.       3 Best Practices -- LMWH       Sandrea Hughs, MD Pulmonary and Critical Care Medicine [facility] Cell [phone]

## 2011-07-23 NOTE — Discharge Summary (Signed)
Physician Discharge Summary  Patient ID: Laurie Chaney MRN: [id number] DOB/AGE: 09-07-1990 20 y.o.  Admit date: 07/18/2011 Discharge date: 07/23/2011  Admission Diagnoses:  Discharge Diagnoses:  Principal Problem:  *Overdose Active Problems:  Respiratory failure, acute  Aspiration pneumonia  ANXIETY  Depression  Brief history This is a 20yo F who friends found down at home with syringe by arm. She was brought to ER where she was found to be cyanotic, HYPOthermic, and low RR. Given narcan with arousal but emesis, then intubated for airway protection. Questionable decerebrate posturing s/p ativan with resolution, NCHCT obtained that was unremarkable. Utox + for opiates (prior to fentanyl in ER), benzos (rx'ed klonopin at home), and THC. Initial glucose in 500s with AG 18 so started on insuling gtt. Currently hemodynamically stable and sedated on propofol. I spoke with both parents who did not have any other relevant history to provide outside of seeing her at [employer] earlier in the day and that she appeared in her usual state.    Hospital Course:   Poly substance  Overdose with history of  ANXIETY and Depression: admitted on 11/24 after above findings. She was supported on vent support. Given IV hydration and clinically improved. Psych was consulted. Out pt recs for intensive therapy was recommended.    Respiratory failure, acute In the setting of heroine over dose.  She was supported on full vent support. Extubated and now breathing without difficulty.     Aspiration pneumonia Vomited after Narcan. Developed aspiration pneumonia. Treated with unasyn. Will be sent home with total of 10d therapy. Told she can follow up on an as needed basis.  Discharge Exam: Patient Vitals for the past 3 hrs:  BP Temp Temp src Pulse Resp SpO2  07/23/11 1300 99/64 mmHg 97.8 F (36.6 C) Oral 91  20  98 %  Exam Young wf nad  Neck no masses or JVD  Lymph no cervical or supraclav LAD  CV RRR, no  murmur  pulm CTAB without wheezes or crackles  abd soft and non-distended, min BS Ext needle marks in antecub bilat (unclear if this is from ER or prior to arrival)  Skin multiple tattoos  cxr 11/28  Improved left lower lobe opacity, question improved infiltrate  Labs at discharge Lab Results  Component Value Date   CREATININE 0.59 07/22/2011   BUN 6 07/22/2011   NA 135 07/22/2011   K 3.2* 07/22/2011   CL 102 07/22/2011   CO2 23 07/22/2011   Lab Results  Component Value Date   WBC 5.3 07/22/2011   HGB 11.3* 07/22/2011   HCT 34.4* 07/22/2011   MCV 85.8 07/22/2011   PLT 159 07/22/2011   Lab Results  Component Value Date   ALT 135* 07/18/2011   AST 366* 07/18/2011   ALKPHOS 133* 07/18/2011   BILITOT 0.2* 07/18/2011   No results found for this basename: INR,  PROTIME    Current radiology studies Dg Chest 2 View  07/21/2011  *RADIOLOGY REPORT*  Clinical Data: Overdose, question pneumonia, prior abnormal chest radiograph  CHEST - 2 VIEW  Comparison: 07/19/2011  Findings: Normal heart size, mediastinal contours, and pulmonary vascularity. Minimal peribronchial thickening. Slightly improved opacity at the left lower lobe since previous exam though mild increase in bibasilar markings persists. Remaining lungs clear. No pleural effusion or pneumothorax. No acute osseous findings.  IMPRESSION: Improved left lower lobe opacity, question improved infiltrate.  Original Report Authenticated By: Lollie Marrow, M.D.    Disposition:  Home or Self Care  Discharge Orders    Future Orders Please Complete By Expires   Diet - low sodium heart healthy      Increase activity slowly      Call MD for:  temperature >100.4      Call MD for:  persistant dizziness or light-headedness      Call MD for:  extreme fatigue        Current Discharge Medication List    START taking these medications   Details  acetaminophen (TYLENOL) 325 MG tablet Take 2 tablets (650 mg total) by mouth every 6  (six) hours as needed (fever). Qty: 30 tablet    amoxicillin-clavulanate (AUGMENTIN) 875-125 MG per tablet Take 1 tablet by mouth 2 (two) times daily. Qty: 10 tablet, Refills: 0      CONTINUE these medications which have NOT CHANGED   Details  clonazePAM (KLONOPIN) 1 MG tablet Take 1 mg by mouth 3 (three) times daily as needed.     lisdexamfetamine (VYVANSE) 60 MG capsule Take 60 mg by mouth every morning.        STOP taking these medications     buPROPion (WELLBUTRIN XL) 150 MG 24 hr tablet      ISOtretinoin (ACCUTANE) 40 MG capsule      lamoTRIgine (LAMICTAL) 200 MG tablet      QUEtiapine (SEROQUEL) 25 MG tablet      zolpidem (AMBIEN) 10 MG tablet        Follow-up Information    Make an appointment with [facility].   Contact information:   [address] [place] [address] [phone]         Discharged Condition: good  Signed: Ralph Brouwer,PETE 07/23/2011, 2:06 PM

## 2011-07-23 NOTE — Progress Notes (Signed)
Pt seen and examined and database reviewed. I agree with above findings, assessment and plan  Shantae Vantol, MD;  PCCM service; Mobile [phone]  

## 2011-07-25 LAB — CULTURE, BLOOD (ROUTINE X 2)
Culture  Setup Time: 201211252045
Culture: NO GROWTH

## 2011-07-28 ENCOUNTER — Ambulatory Visit (HOSPITAL_COMMUNITY)
Admission: RE | Admit: 2011-07-28 | Discharge: 2011-07-28 | Disposition: A | Discharge status: Home / Self Care | Payer: BC Managed Care – PPO | Attending: Psychiatry | Admitting: Psychiatry

## 2011-07-28 ENCOUNTER — Encounter (HOSPITAL_COMMUNITY): Payer: Self-pay | Admitting: Psychology

## 2011-07-28 DIAGNOSIS — F1210 Cannabis abuse, uncomplicated: Secondary | ICD-10-CM | POA: Insufficient documentation

## 2011-07-28 DIAGNOSIS — F1110 Opioid abuse, uncomplicated: Secondary | ICD-10-CM | POA: Insufficient documentation

## 2011-07-28 NOTE — BH Assessment (Signed)
Assessment Note   Laurie Chaney is an 20 y.o. female. PT WAS REFERRED BY ANN EVAN, THERAPIST FOR THE CD IOP PROGRAM. STATES PT WILL BE STARTING PROGRAM ON 07/31/11 AFTER PT HAD OVERDOSE ON HEROINE. PT CLAIMS SHE WAS UNDER STRESS WITH LEGAL ISSUES & FELT THAT LAWYER WAS NOT OF HELP TO HER CASE WHICH WAS VERY STRESSFUL FOR HER. PT STATES SHE IS ON PROBATION FOR DRUG PROCESSION AFTER GIVING ONE OF HER FRIEND SOME OF HER KLONOPIN WHEN THEY HAD AN ANXIETY ATTACK. PT STATES SHE DECIDED TO TRY HEROINE ONCE WHEN IT ALSO TOOK HER LIFE. PT EXPRESSED BEING SCARED & NOT WANTING TO CONTINUE LIFE THAT WAY. PT ADMITS TRYING OTHER KINDS OF DRUGS WHILE IN HIGH SCHOOL BUT MOST RECENTLY HAD BEEN USING THC 3-4 X WEEKLY. PT ADMITS HAVING A HX OF CUTTING & CONSIDERS PARENTS HER PRIMARY SUPPORT SYSTEM. PT DENIES ANY IDEATION & IS ABLE TO CONTRACT FOR SAFETY.  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 Acne   . Migraine   . Anxiety   . Seizures     had 6 none since 4th grade  . Depression     hx of wellbutrin and lexapro use  . History of recurrent UTIs     as a child had evaluation  . History of varicella   . Overdose 07/23/2011  . Respiratory failure, acute 07/23/2011   Axis IV: other psychosocial or environmental problems and problems related to legal system/crime Axis V: 51-60 moderate symptoms  Past Medical History:  Past Medical History  Diagnosis Date  . Acne   . Migraine   . Anxiety   . Seizures     had 6 none since 4th grade  . Depression     hx of wellbutrin and lexapro use  . History of recurrent UTIs     as a child had evaluation  . History of varicella   . Overdose 07/23/2011  . Respiratory failure, acute 07/23/2011    Past Surgical History  Procedure Date  . Tympanostomy tube placement     age 50-4    Family History:  Family History  Problem Relation Age of Onset  . Hypertension Mother   . Depression Mother     . Anxiety disorder Mother     Social History:  reports that she has never smoked. She does not have any smokeless tobacco history on file. She reports that she drinks alcohol. She reports that she does not use illicit drugs.  Allergies: No Known Allergies  Home Medications:  Medications Prior to Admission  Medication Sig Dispense Refill  . acetaminophen (TYLENOL) 325 MG tablet Take 2 tablets (650 mg total) by mouth every 6 (six) hours as needed (fever).  30 tablet    . amoxicillin-clavulanate (AUGMENTIN) 875-125 MG per tablet Take 1 tablet by mouth 2 (two) times daily.  10 tablet  0  . clonazePAM (KLONOPIN) 1 MG tablet Take 1 mg by mouth 3 (three) times daily as needed.       Marland Kitchen lisdexamfetamine (VYVANSE) 60 MG capsule Take 60 mg by mouth every morning.         No current facility-administered medications on file as of 07/28/2011.    OB/GYN Status:  No LMP recorded.  Disposition: CD IOP    On Site Evaluation by:   Reviewed with Physician:     Waldron Session 07/28/2011 3:05 PM

## 2011-07-28 NOTE — Progress Notes (Signed)
Patient ID: Laurie Chaney, female   DOB: [birth year], 20 y.o.   MRN: [id number] Orientation: The patient is a 20 yo single, caucasian, female seeking entry into the CD-IOP. She was accompanied by her parents. She lives in [place] with her parents and younger brother. The incident that generated this meeting was a heroin overdose on November 24th. The parents received a call that their daughter had overdosed and was unresponsive upon arrival at [facility]. The patient reported that it was her first time trying heroin and a friend had "shot her up". Upon further review, the patient admitted she is on probation for an event that occurred approximately 4 months ago when she was arrested after giving a friend some of her Klonipin. She explained that he had a script for Klonipin, but was out of it and was in the midst of a panic attack when she offered him some of hers. The transaction was observed by a Emergency planning/management officer and she was arrested. The probation has been transferred to [facility] and her probation officer is Carmelia Bake. With a recent THC positive drug screen, she has violated the terms of her probation, but she admitted that Officer Hollice Espy has no knowledge of this recent heroin overdose and she does not intend to share this information with him at this time. When asked about the Klonipin prescription, the patient admitted she has been monitored here in [facility] by Dr. Evelene Croon for depression and anxiety since she was 20 yo. She has been prescribed Lamictal and Klonipin, but admitted she had stopped taking the Lamictal and has not had any Klonipin since the overdose. The patient denied any other legal issues, but admitted that she had once been hospitalized at [facility] as a teen-ager after her parents discovered she was cutting herself. She admitted that she felt relief when she cut herself. The patient has a genetic predisposition to addiction with a history of chemical dependency on both sides of  her family. She reported that she had smoked marijuana since age 19, but only smoked 2-3 days per week. She minimized the significance of her marijuana use and is likely underreporting the frequency of her opiate use. The patient spoke softly and coughed a number of times during the orientation. She apologized and explained that she had contracted pneumonia from aspirating when she overdosed. All documentation was reviewed, signatures obtained and the orientation completed successfully. The patient has an appointment with her [employer] case manager at 2 pm tomorrow so we agreed she will return on Friday, December 7, at 1 pm and begin the CD-IOP.

## 2011-07-31 ENCOUNTER — Other Ambulatory Visit (HOSPITAL_COMMUNITY): Payer: BC Managed Care – PPO | Admitting: Psychology

## 2011-07-31 DIAGNOSIS — L708 Other acne: Secondary | ICD-10-CM | POA: Insufficient documentation

## 2011-07-31 DIAGNOSIS — R51 Headache: Secondary | ICD-10-CM | POA: Insufficient documentation

## 2011-07-31 DIAGNOSIS — J019 Acute sinusitis, unspecified: Secondary | ICD-10-CM | POA: Insufficient documentation

## 2011-07-31 DIAGNOSIS — Z87440 Personal history of urinary (tract) infections: Secondary | ICD-10-CM | POA: Insufficient documentation

## 2011-07-31 DIAGNOSIS — F1920 Other psychoactive substance dependence, uncomplicated: Secondary | ICD-10-CM | POA: Insufficient documentation

## 2011-07-31 DIAGNOSIS — F411 Generalized anxiety disorder: Secondary | ICD-10-CM | POA: Insufficient documentation

## 2011-08-03 ENCOUNTER — Telehealth (HOSPITAL_COMMUNITY): Payer: Self-pay | Admitting: Psychology

## 2011-08-03 ENCOUNTER — Other Ambulatory Visit (HOSPITAL_COMMUNITY): Payer: BC Managed Care – PPO | Admitting: Psychology

## 2011-08-03 DIAGNOSIS — F1920 Other psychoactive substance dependence, uncomplicated: Secondary | ICD-10-CM

## 2011-08-03 NOTE — Progress Notes (Signed)
    Daily Group Progress Note  Program: CD-IOP   Group Time: 1-2:30 pm  Participation Level: Minimal  Behavioral Response: Sharing  Type of Therapy: Psycho-education Group  Topic: Sleep Hygiene and Self Care: A presentation was provided on the topic of Sleep Hygiene.  A handout was given to all members identifying the 5 stages of sleep and detailed descriptions of each stage of sleep. The discussion went on to discuss general concepts around self-care and the various elements of recovery. There was good discussion about personal issues around sleep and the ways one's active addiction impacted their sleep. One member who had relapsed last weekend admitted his sleep is still very disrupted. There was good feedback and disclosure during this presentation.     Group Time: 2:45-4 pm  Participation Level: Active  Behavioral Response: Appropriate and Sharing  Type of Therapy: Process Group  Topic: Group Process: Members discussed issues they are current dealing with in early recovery. Included in this part of group was a guided relaxation exercise. The purpose of this exercise was to emphasize the importance of group members staying balanced and focused, in part, through relaxation and meditation exercises. A new member was present and she introduced herself and answered numerous questions from fellow group members asking about her history of drugs and degree of acceptance of her chemical dependency.   Summary: The patient was new to the group and during the check-in, she described herself as an "addict". She said little during the presentation on sleep, which is to be expected as a new group member, but during group process, she opened up and shared about her reasons for being here. Laurie Chaney reported she had tried heroin only once and ended up overdosed in the hospital. I noted that she sounds much better than when I had first met her and she admitted she is feeling much better. Laurie Chaney  explained that she had aspirated when she had overdosed and developed pneumonia. She described feeling lonely because almost all of her friends get high and she can't be around them. She shared the details of her legal problems, including being on probation and having tested positive for THC. When a patient asked about her boyfriend, Laurie Chaney reported that he is going to quit using as a way of supporting her. She reported that she would be attending some 12-step meetings over the weekend. The patient made some excellent comments and responded very well to this first group session.   Family Program: Family present? No   Name of family member(s):   UDS collected: No Results:   AA/NA attended?: No, patient is new to this program Sponsor?: No   Warrick Llera, LCAS

## 2011-08-04 ENCOUNTER — Telehealth (HOSPITAL_COMMUNITY): Payer: Self-pay | Admitting: Psychology

## 2011-08-04 LAB — DRUG SCREEN, URINE
Amphetamine Screen, Ur: NEGATIVE
Benzodiazepines.: NEGATIVE
Marijuana Metabolite: NEGATIVE
Phencyclidine (PCP): NEGATIVE

## 2011-08-04 NOTE — Progress Notes (Signed)
    Daily Group Progress Note  Program: CD-IOP   Group Time: 1-2:30 pm  Participation Level: Active  Behavioral Response: Appropriate and Sharing  Type of Therapy: Psycho-education Group  Topic: Chaplain: the first part of group was spent with a visiting chaplain, Leola Brazil. She introduced herself and group members introduced themselves. Elease Hashimoto read from a book she had brought. It focused on the pain that an oyster goes through to build a pearl. The overriding emphasis was on the suffering and how our losses and pain become a part of [place]. The holiday season and particular difficulties that Christmas represents was discussed. Members shared about their own losses and how they have been transformed by the pain and suffering.      Group Time: 2:45-4 pm  Participation Level: Minimal  Behavioral Response: patient was attentive, but said little in process  Type of Therapy: Process Group  Topic: Group Process; second half of group was spent in process. Members discussed their feelings and current struggles. Some talked about how they are doing in recovery while one member recounted a very close call he had had over the weekend. There was good disclosure and feedback among the group.      Summary: The patient shared openly in the session with the chaplain today. She expressed frustration over the legal problems she has created and the poor guidance she had received from the court appointed-attorney. She reported she had felt very connected to her spiritual self while she was living in [place], but felt stifled living here in [place] with her parents. The patient reported while she has been attracted to Buddhist thought, her father is a strong Christian and this conflict has proven troubling in their relationship. While she "owned" her stuff, the patient sounded a little like a victim in these disclosures. She received good feedback from the chaplain and was "heard" by her group  members. This disclosure seemed to provide some therapeutic value to this new group member.    Family Program: Family present? No   Name of family member(s):   UDS collected: Yes Results:   AA/NA attended?: [place]  Sponsor?: No   Christe Tellez, LCAS

## 2011-08-05 ENCOUNTER — Other Ambulatory Visit (HOSPITAL_COMMUNITY): Payer: BC Managed Care – PPO

## 2011-08-07 ENCOUNTER — Other Ambulatory Visit (HOSPITAL_COMMUNITY): Payer: BC Managed Care – PPO

## 2011-08-10 ENCOUNTER — Other Ambulatory Visit (HOSPITAL_COMMUNITY): Payer: BC Managed Care – PPO | Admitting: Psychology

## 2011-08-11 NOTE — Progress Notes (Signed)
    Daily Group Progress Note  Program: CD-IOP   Group Time: 1-2:30 pm  Participation Level: Active  Behavioral Response: Appropriate and Sharing  Type of Therapy: Psycho-education Group  Topic: The Disease Concept of Addiction: first half of group spent presenting the disease concept of addiction. The idea was emphasized that once one has become chemically dependent, it is imperative that they remain free of all mind-altering chemicals. One patient wondered if this meant she couldn't smoke pot any more? I explained what we see happen when one stops using their primary drug of addiction, but continue to use others substances. Typically, they become addicted to the other substance or it brings them back to their primary drug of addiction. Bu the time this session ended, each member understood the importance of total abstinence in recovering from chemical dependency.       Group Time: 2:45- 4 pm  Participation Level: Active  Behavioral Response: Sharing and Minimizing  Type of Therapy: Process Group  Topic: Group Process: second half of group was spent in process. Members shared their current feelings and frustrations in early recovery. One member had relapsed over the weekend and admitted she had been questioning whether she was really an alcoholic. There were 2 new group members and they introduced themselves in this half of group. Both had experienced significant losses and attributed their grief and pain to an increase in their use of chemicals. The session went well with good feedback among the group.    Summary:  The patient reported she had remained sober over the weekend, but expressed frustration and hurt about her boyfriend. [name] explained that although he has stopped using opiates, he is still taking Xanax and smoking pot. When asked about attending meetings over the weekend, she admitted that she had gone to one, but left early and when she wanted to go to others her  boyfriend had decided not to go with her and she had stayed home. It became quite clear that she is in a very unhealthy and codependent relationship. She continued to describe her problems with being in [place] and how much better everything is in [place]. Finally, the patient herself admitted she was making excused and agreed to attend a meeting tonight with a fellow group member. It appears that the patient is beginning to gain some insight into her life and the damage that drugs have done. She made some good comments.   Family Program: Family present? No   Name of family member(s):   UDS collected: No Results:   AA/NA attended?: YesFriday  Sponsor?: No   Laurie Chaney, LCAS

## 2011-08-12 ENCOUNTER — Other Ambulatory Visit (HOSPITAL_COMMUNITY): Payer: BC Managed Care – PPO | Admitting: Psychology

## 2011-08-12 DIAGNOSIS — F1920 Other psychoactive substance dependence, uncomplicated: Secondary | ICD-10-CM

## 2011-08-13 ENCOUNTER — Encounter (HOSPITAL_COMMUNITY): Payer: Self-pay | Admitting: Psychology

## 2011-08-13 LAB — DRUG SCREEN, URINE
Cocaine Metabolites: NEGATIVE
Creatinine,U: 67.2 mg/dL
Marijuana Metabolite: NEGATIVE
Opiates: NEGATIVE
Phencyclidine (PCP): NEGATIVE

## 2011-08-13 NOTE — Progress Notes (Signed)
Patient ID: Laurie Chaney, female   DOB: November 19, 1990, 20 y.o.   MRN: [id number] The patient was scheduled to meet with me for her treatment planning session at 10 am. She did not appear nor did she phone. About 30 minutes later I received a message from [name] and she sounded very upset. She reported she had woken up at her boyfriend's and he was gone along with her car. He doesn't have a phone and she couldn't contact him. She admitted she was very upset, but didn't know what he was doing and she was worried. We exchanged messages and finally I was able to speak with her. She explained that he had finally come home and had been Christmas shopping. I suggested that we could meet tomorrow at 11 am. She stated she had a 10 am doctor's appointment, but would appear at 11 am for our appointment. Before our conversation ended, the patient reported she had been on her way to a 12-step meeting and felt a panic attack coming on. She had found some old klonipin and taken one. She reported she had told her mother about this and her mother had thrown the bottle of pills into the toilet. [name] apologized, but I informed her that her drug test was negative, but I thanked her for having been honest and told me She stated she would see me tomorrow.

## 2011-08-13 NOTE — Progress Notes (Signed)
    Daily Group Progress Note  Program: CD-IOP   Group Time: 1-2:30 pm  Participation Level: Active  Behavioral Response: Appropriate and Sharing  Type of Therapy: Psycho-education Group  Topic: Building a Recovery Plan: first half of group included a presentation on basic recovery concepts and how to build a daily recovery plan. Group members participated in the discussion and identified the many different issues one must address in early recovery. There was good disclosure with emphasis on being open and honest and reaching out for help. The importance of meetings and getting to know other people in recovery was noted. Members shared about different meetings they find particularly helpful and they encouraged the newer members, who are still hesitant, to go to meetings.      Group Time: 2:45-4 pm  Participation Level: Active  Behavioral Response: Appropriate and Sharing  Type of Therapy: Process Group  Topic: Group Process: Second half of group was spent in process. Members discussed current issues and struggles they are dealing with. There was good feedback with a number of members encouraging the importance of spirituality in their daily life.     Summary: The patient reported she has been very disappointed that her boyfriend has not stopped using when she thought he would stop because of her. She admitted that she has come to realize that her wanting them to do something is not going to have any impact on them. "I realized that I can't control them", she reported. The group applauded this announcement and validated this belief. Laurie Chaney also noted that she has noticed how her friend, who is using heroin daily, has not contacted her and displays no concern about her. She admitted that her friend is not the same person that she once was and it is clear that her heroin use has changed her. When asked about whether she attended a meeting, Laurie Chaney reported that she had attended an AA  meeting, but introduced herself as an 'addict'. the members of the meeting had responded poorly and asked her to leave. This invited a discussion about identity, but most group members agreed that it was inappropriate to treat her as they had. She made some good comments and responded well to this intervention.   Family Program: Family present? No   Name of family member(s):   UDS collected: Yes Results: negative  AA/NA attended?: YesTuesday  Sponsor?: No   Laurie Chaney, LCAS

## 2011-08-14 ENCOUNTER — Other Ambulatory Visit (HOSPITAL_COMMUNITY): Payer: BC Managed Care – PPO

## 2011-08-17 ENCOUNTER — Other Ambulatory Visit (HOSPITAL_COMMUNITY): Payer: BC Managed Care – PPO

## 2011-08-19 ENCOUNTER — Encounter (HOSPITAL_COMMUNITY): Payer: Self-pay | Admitting: Psychology

## 2011-08-19 ENCOUNTER — Other Ambulatory Visit (HOSPITAL_COMMUNITY): Payer: BC Managed Care – PPO

## 2011-08-19 NOTE — Progress Notes (Signed)
Patient ID: Laurie Chaney, female   DOB: 1991/05/19, 20 y.o.   MRN: [id number] I phoned the patient's father this morning. Laurie Chaney had not returned my call over the weekend and I phoned to express concern about her well-being. Laurie Chaney answered and confirmed that he had known she had not come to group, but was under the impression that she had contacted me about her absence. He seemed confused about the days last week and noted she had become very anxious one day because her boyfriend had not shown up as he had promised. When I asked him whether he and his wife had set down "house rules", he admitted that they had differences of opinion and they had not made any rules. He also admitted Laurie Chaney was not there, but with her boyfriend this morning. Laurie Chaney seemed to be making all sorts of excused for his daughter and I emphasized the dangers of enabling his daughter. I also encouraged Laurie Chaney to meet me with his wife and the 3 of [place] could discuss how best to support their daughter, but also hold her accountable. I suggested they come in tomorrow afternoon and he stated he would speak with his wife and get back with me. I will wait to hear from him.

## 2011-08-20 ENCOUNTER — Encounter (HOSPITAL_COMMUNITY): Payer: Self-pay | Admitting: Psychology

## 2011-08-20 NOTE — Progress Notes (Signed)
Patient ID: Laurie Chaney, female   DOB: 02-07-1991, 20 y.o.   MRN: [id number] I met with [name]'s parents, Thayer Ohm and [employer], this afternoon. They had agreed to come in to speak with me about their daughter's lack of compliance in group and what actions they should take, if any? They proceeded to offer any number of excuses into [name]'s behavior, but the more we talked, and the more I described the destructive nature of enabling, they began to agree that there need to be consequences should she not appear for treatment and comply with treatment requirements. They agreed that [name] doesn't seem to understand that I am in touch with her [facility] case manager, Casimer Lanius, and how she is in contact with the Engineer, drilling. We are all working together to insure that the patient is compliant and drug-free. I was uncertain about whether [name] had signed a consent for Dr. Evelene Croon, her psychiatrist, but agreed to double-check and contact the Physician later today if possible. Among other things, it was agreed that I would phone the parents if [name] does not appear for the CD-IOP group sessions. I also informed them that I would be contacting [facility] today and report that the patient has missed the last 2 sessions and not phoned to explain her absence. The parents agreed to present their daughter with some house rules that will require her to be more accountable to them, including spending week nights at their home. At this time, [name] spends much of her time at her boyfriend's home who is admittedly, still using drugs. The parents thanked me and our session went well. Will keep them informed as to [name]'s compliance and engagement in treatment.

## 2011-08-21 ENCOUNTER — Other Ambulatory Visit (HOSPITAL_COMMUNITY): Payer: BC Managed Care – PPO | Attending: Physician Assistant | Admitting: Psychology

## 2011-08-24 ENCOUNTER — Other Ambulatory Visit (HOSPITAL_COMMUNITY): Payer: BC Managed Care – PPO | Admitting: Psychology

## 2011-08-24 DIAGNOSIS — F1920 Other psychoactive substance dependence, uncomplicated: Secondary | ICD-10-CM

## 2011-08-24 NOTE — Progress Notes (Signed)
    Daily Group Progress Note  Program: CD-IOP   Group Time: 1-2:30 pm  Participation Level: Active  Behavioral Response: Appropriate and Sharing  Type of Therapy: Activity Group  Topic: The first half of group consisted of an activity. Members were asked to list 3 things about themselves on a small sheet of paper. Of those 3 things only 1 was true. The slips of paper were placed in a basket and a member drew 1 sheet out and then read it to the fellow group members. The group was challenged to identify who the Thereasa Parkin was and which of the 3 statements was the true one.  The activity proved very challenging and, in most instances, it took numerous times to determine the author. The purpose of the activity was to invite disclosure about one's self, typically about unusual elements or experiences, and promote a greater feeling of closeness among group members. At the conclusion of the exercise, members commented on how much they had enjoyed the activity and getting to know each other better.       Group Time: 2:45- 4 pm  Participation Level: Active  Behavioral Response: Appropriate  Type of Therapy: Process Group  Topic: The second half of group was spent in process. Members discussed their current feelings in early recovery. One member shared about her roommate and the roommate's boyfriend, who continue to bring alcohol into the apartment despite the group member's instructions to remain an alcohol and drug-free home. The group offered feedback about how to address this problem and agreed to come to the home later this weekend if she needed help moving the roommate out. There was good support and validation provided. I instructed group members to formulate plans for the coming New Year's holiday and emphasized that they make commitments and become accountable to others in recovery.     Summary: The patient laughed during the activity and appeared to be enjoying herself. She was able to  confuse the group and they mis-identified her on a number of different occasions. She disclosed that she had hiked the entire [employer] and was applauded for this accomplishment. In process, she reported that it is very painful to be around her friend who has been using heroin. She admitted that her friend isn't the same person that she once knew and agreed that just because she is making changes doesn't mean anyone else will. She made some good comments and responded well to this intervention.    Family Program: Family present? No   Name of family member(s):   UDS collected: No Results:   AA/NA attended?: [place]  Sponsor?: No   Lem Peary, LCAS
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
## 2011-08-25 LAB — DRUG SCREEN, URINE
Amphetamine Screen, Ur: NEGATIVE
Barbiturate Quant, Ur: NEGATIVE
Cocaine Metabolites: NEGATIVE
Opiates: NEGATIVE

## 2011-08-26 ENCOUNTER — Other Ambulatory Visit (HOSPITAL_COMMUNITY): Payer: BC Managed Care – PPO | Attending: Physician Assistant | Admitting: Psychology

## 2011-08-26 DIAGNOSIS — R51 Headache: Secondary | ICD-10-CM | POA: Insufficient documentation

## 2011-08-26 DIAGNOSIS — L708 Other acne: Secondary | ICD-10-CM | POA: Insufficient documentation

## 2011-08-26 DIAGNOSIS — J019 Acute sinusitis, unspecified: Secondary | ICD-10-CM | POA: Insufficient documentation

## 2011-08-26 DIAGNOSIS — F411 Generalized anxiety disorder: Secondary | ICD-10-CM | POA: Insufficient documentation

## 2011-08-26 DIAGNOSIS — Z87440 Personal history of urinary (tract) infections: Secondary | ICD-10-CM | POA: Insufficient documentation

## 2011-08-26 DIAGNOSIS — F1920 Other psychoactive substance dependence, uncomplicated: Secondary | ICD-10-CM | POA: Insufficient documentation
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
## 2011-08-27 LAB — DRUG SCREEN, URINE
Amphetamine Screen, Ur: NEGATIVE
Benzodiazepines.: NEGATIVE
Marijuana Metabolite: NEGATIVE
Methadone: NEGATIVE

## 2011-08-27 NOTE — Progress Notes (Signed)
    Daily Group Progress Note  Program: CD-IOP   Group Time: 1-2:30 pm  Participation Level: Active  Behavioral Response: Appropriate  Type of Therapy: Psycho-education Group  Topic: Sources of Support. First half of group spent identifying the various sources of support one has in their/her life. A handout was provided asking that one identify the different sources of support they have. The categories included: family, friends. Job, school, 12-step groups, religion, housing, legal, health care and recreational activities. Members were instructed to list who they have in each category or any other category. Upon completion group members shared their lists on the board. There was a significant discrepancy between members. While some had long lists of people in their lives, others had only 2-3 people. The importance of securing support in different parts of one's life was emphasized and the group discussed, at length, how to secure more support.      Group Time: 2:45- 4 pm  Participation Level: Active  Behavioral Response: Appropriate  Type of Therapy: Process Group  Topic: The second half of group was spent in process. Members shared about current issues and concerns in their lives. There was good feedback and discussion among the group.    Summary:The patient reported she had had a great New Year's and for the first time in many years she could actually remember the concert. She admitted that for the past 7 or 8 years she has been very high on New Year's and never remembers anything. In process, the patient reported her father had lost his job today. She expressed concern about what would happen and noted that this had happened 11 years ago and had proven to be a terrible time for the entire family. Elena reported her parents had argued terribly during this time and her mother had sunk into a deep depression. She expressed frustration over the entire issue of money and admitted she  had wanted to start a "commune" at one time. When I asked the group what Kasandra could do to best support her family at this difficult time, they all agreed that sobriety would be very helpful. The patient has shared much more about her life and struggles in the past few sessions and her growing transparency is a very positive step for her. She responded well to this intervention.     Family Program: Family present? No   Name of family member(s):   UDS collected: Yes Results:   AA/NA attended?: YesMonday, Tuesday and Friday  Sponsor?: No   Laurie Chaney, LCAS

## 2011-08-28 ENCOUNTER — Other Ambulatory Visit (HOSPITAL_COMMUNITY): Payer: BC Managed Care – PPO | Admitting: Psychology

## 2011-08-28 DIAGNOSIS — F4010 Social phobia, unspecified: Secondary | ICD-10-CM

## 2011-08-28 MED ORDER — CITALOPRAM HYDROBROMIDE 20 MG PO TABS: 20.0000 mg | ORAL_TABLET | Freq: Every day | ORAL | Status: DC

## 2011-08-31 ENCOUNTER — Other Ambulatory Visit (HOSPITAL_COMMUNITY): Payer: BC Managed Care – PPO | Admitting: Psychology

## 2011-08-31 NOTE — Progress Notes (Signed)
    Daily Group Progress Note  Program: CD-IOP   Group Time: 1-2:30 pm  Participation Level: Active  Behavioral Response: Appropriate and Sharing  Type of Therapy: Psycho-education Group  Topic:Guest Speaker: the first half of group today was spent with a guest speaker. This woman had graduated from the CD-IOP program here at [facility] approximately 5 months ago and had obtained almost 8 months of sobriety. She shared about herself and struggles in addiction. She described how she has remained alcohol and drug-free and what she does on a daily basis to remain that way.       Group Time: 2:45-4 pm  Participation Level: Active  Behavioral Response: Appropriate  Type of Therapy: Process Group  Topic: Group Process; the second half of group was spent in process. Members discussed current struggles and issues in early recovery. One member admitted she has not been doing what she needs to do to remain sober and stated that she wanted to be like the guest speaker and have almost 8 months of sobriety. Another shared some of her concerns in returning to college next week. There was good disclosure and support among group members.    Summary: The patient was very attentive to the speaker this afternoon. In process, she admitted she is struggling with her boyfriend. The patient explained that he is trying not to use because his father is drug testing him, but he won't go to meetings with her. "I guess he is just dry", she reported. The patient noted that her friend continues to get more heavily into heroin and she reported her friend's boyfriend gets her the heroin and seems to benefit from her being addicted. The patient reported her father is doing fairly well despite having lost his job on earlier in the week. When another member admitted feeling overwhelmed by her addiction, this patient reminded her that we only focus on "one day at a time" and she should just "fake it until you make it". These  comments were genuinely expressed and other group members applauded this patient's progress and increased insight into recovery. She responded well to this intervention.   Family Program: Family present? No   Name of family member(s):   UDS collected: No Results:   AA/NA attended?: YesWednesday and Thursday  Sponsor?: No   Kalvyn Desa, LCAS
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
## 2011-09-02 ENCOUNTER — Other Ambulatory Visit (HOSPITAL_COMMUNITY): Payer: BC Managed Care – PPO | Admitting: Psychology

## 2011-09-02 DIAGNOSIS — F1920 Other psychoactive substance dependence, uncomplicated: Secondary | ICD-10-CM

## 2011-09-03 LAB — DRUG SCREEN, URINE
Amphetamine Screen, Ur: NEGATIVE
Cocaine Metabolites: NEGATIVE
Marijuana Metabolite: NEGATIVE
Opiates: NEGATIVE
Phencyclidine (PCP): NEGATIVE
Propoxyphene: NEGATIVE

## 2011-09-04 ENCOUNTER — Other Ambulatory Visit (HOSPITAL_COMMUNITY): Payer: BC Managed Care – PPO
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
## 2011-09-07 ENCOUNTER — Other Ambulatory Visit (HOSPITAL_COMMUNITY): Payer: BC Managed Care – PPO | Admitting: Psychology
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
## 2011-09-09 ENCOUNTER — Other Ambulatory Visit (HOSPITAL_COMMUNITY): Payer: BC Managed Care – PPO | Admitting: *Deleted

## 2011-09-10 NOTE — Progress Notes (Signed)
    Daily Group Progress Note  Program: CD-IOP  Group Time: 1:00- 2:00 pm  Participation Level: Active  Behavioral Response: Sharing, Rationalizing and Minimizing  Type of Therapy:  Education and Training Group  Summary of Progress: Group time was spent discussing reasons that the members stay abstinent from drugs and alcohol and exploring the though process of their relapses when they occur. Members completed a Decisional Balance sheet handout which listed immediate and future consequences for both staying off substances and using. They also completed a sheet about their own personal reasons to stay abstinent that they are to keep nearby to help in times of urges and craving to "think through the drink" or "play the tape to the end". Ailene minimally completed the group task and shared with the members. She reports missing being high and the group spent some time exploring the idea of grief and loss around sobriety. She minimizes her use and the consequences of her use- including the heroin overdose.      Group Time: 2:00- 3:00 pm  Participation Level:  Active  Behavioral Response: Appropriate  Type of Therapy: Process Group  Summary of Progress: Group time was spent processing the first half of group, participating in guided meditation and planning for the next 48 hours of sobriety until CD-IOP meets again. Pt reported really enjoying the mediation and that it reminded her of how much she likes to do that. She said she plans to attend an NA meeting and admitted that there is no reason she isn't going to those daily. Pt is really struggling with a lack of motivation to remain abstinent from drugs and OH.   Aris Lot, COUNS

## 2011-09-11 ENCOUNTER — Other Ambulatory Visit (HOSPITAL_COMMUNITY): Payer: BC Managed Care – PPO | Admitting: Psychology

## 2011-09-14 ENCOUNTER — Other Ambulatory Visit (HOSPITAL_COMMUNITY): Payer: BC Managed Care – PPO | Admitting: Psychology

## 2011-09-14 DIAGNOSIS — F1920 Other psychoactive substance dependence, uncomplicated: Secondary | ICD-10-CM

## 2011-09-14 NOTE — Progress Notes (Signed)
    Daily Group Progress Note  Program: CD-IOP   Group Time: 1-2:30 pm  Participation Level: Active  Behavioral Response: Appropriate and Sharing  Type of Therapy: Psycho-education Group  Topic: Examining a Relapse; in group today a member admitted she had drank yesterday. She had spent much of the day drinking and expressed remorse. The first part of group was spent examining the day and how she had ended up drinking and identifying what she could have done differently? The group provided this member with good feedback and encouraged her to talk about it. There was good discussion and the group was able to identify a multitude of opportunities to have interrupted the relapse if not avoid it altogether.       Group Time: 2:45-4 pm  Participation Level: Active  Behavioral Response: Appropriate and Sharing  Type of Therapy: Process Group  Topic: Group Process; second half of group spent in process. Members shared about their current struggles and issues. There was good disclosure among the group.    Summary: The patient was able to identify a number of different things the group member might have done to avoid drinking. She reported she is doing well in her recovery, but admitted there is still some reticence about eliminating marijuana because she has found it to be so helpful to her. The patient provided good feedback.    Family Program: Family present? No   Name of family member(s):   UDS collected: No Results:   AA/NA attended?: YesTuesday and Thursday  Sponsor?: No   Lunabelle Oatley, LCAS

## 2011-09-15 LAB — PRESCRIPTION ABUSE MONITORING 17P, URINE
Benzodiazepine Screen, Urine: NEGATIVE ng/mL
Cannabinoid Scrn, Ur: NEGATIVE ng/mL
Cocaine Metabolites: NEGATIVE ng/mL
Creatinine, Urine: 45.17 mg/dL
MDMA URINE: NEGATIVE ng/mL
Meperidine, Ur: NEGATIVE ng/mL
Methadone Screen, Urine: NEGATIVE ng/mL
Tapentadol, urine: NEGATIVE ng/mL
Tramadol Scrn, Ur: NEGATIVE ng/mL
Zolpidem, Urine: NEGATIVE ng/mL

## 2011-09-15 NOTE — Progress Notes (Signed)
    Daily Group Progress Note  Program: CD-IOP   Group Time: 1-2:30 pm  Participation Level: Active  Behavioral Response: Appropriate and Sharing  Type of Therapy: Psycho-education Group  Topic: Self-Esteem; learning how to improve your self-esteem: a presentation was provided with accompanying handout. The discussion dealt with how we develop our self-esteem and what we can do to improve it. The topic seemed fitting as all 5 group members admitted they have very low self-esteem. The importance of behaviors and actions being congruent with one's values was emphasized. Members were asked to identify values and subsequent behaviors that would reflect these values. There was good engagement during the session.     Group Time: 2:45-4 pm  Participation Level: Active  Behavioral Response: Appropriate and Sharing  Type of Therapy: Process Group  Topic: Group process: second half of group spent in process. Members shared the difficulties they are having in early recovery. One member admitted she had drank alcohol yesterday and reported, "I can't do it". The group shared their own experiences with 'slips' and the stress of a sobriety date was discussed. The group is struggling with their addictions and seems unable to relate the many problems or consequences of their alcohol and drug use to their problems in other aspects of life.    Summary: The patient reported her boyfriend is doing well at [facility] and she had visited him over the weekend. She admitted she had learned some things about him that had proven very disappointing, though, and that she felt it was right that they both focus on their own recovery. She reported she still struggles with missing her friends because they all get high and she can't be around it. She admitted she has low self-esteem and it comes, in part, from the constant criticizing she has received through the years - particularly from her parents. The patient agreed that  being affirmed and validated would be far more helpful than just getting criticized. She reminded the group that she is plenty hard on herself and doesn't need help putting herself down. When I questioned whether she has undertaken any of the ideas we had identified in our individual session, she reported she had gone onto the Buddhist website and discovered that they have a meditation scheduled for Wednesday. Other than this, she has really done little to change behaviors and attempt to live more congruently with her values. The patient made some good comments and responded well to this intervention.    Family Program: Family present? No   Name of family member(s):   UDS collected: Yes Results:results not yet available  AA/NA attended?: YesFriday and Sunday  Sponsor?: No   Praneeth Bussey, LCAS

## 2011-09-16 ENCOUNTER — Other Ambulatory Visit (HOSPITAL_COMMUNITY): Payer: BC Managed Care – PPO | Admitting: Psychology

## 2011-09-17 NOTE — Progress Notes (Signed)
    Daily Group Progress Note  Program: CD-IOP   Group Time: 1-2:30 pm  Participation Level: Active  Behavioral Response: Appropriate and Sharing  Type of Therapy: Psycho-education Group  Topic: Visit with the Pharmacist: first half of group was spent with the [facility] pharmacist, Peggye Fothergill. She discussed the effects of alcohol and drugs on the brain and body. Included in the discussion was the symptoms of impairment and withdrawal. There was a detailed explanation on the potential dangers that hallucinogens represent and the types of psychosis that are frequently displayed. The group was very attentive and asked questions about their medications and some of the mental health issues, particularly anxiety, that they struggle with. There was excellent feedback and disclosure among the group.     Group Time: 2:45-4 pm  Participation Level: Active  Behavioral Response: Appropriate  Type of Therapy: Process Group  Topic: Group Process; Second half of group spent in process. A member had returned after an absence of 4 sessions and he disclosed the harrowing trip he had taken with a drug dealer down to [place]. Another member had returned after a period of time at [facility]. She discussed what will be different this time and the requirements of living in a halfway house. There was good discussion and sharing about current issues and concerns.    Summary: the patient was attentive and engaged actively in the discussion with the pharmacist. She admitted having taken "acid, ex, and MDMA" many times and really enjoying the experience. She questioned whether all of the bad publicity about hallucinogens was legitimate or just trying to scare people. The pharmacist identified the many potential problems associated with these chemicals and noted the delusions, paranoia, and hallucinations that can result and may leave the user permanently altered. The patient received the information graciously and seemed  willing to hear this information. She reported she is doing well in her recovery and remains sober. She talked about the importance of meetings and noted that she prefers NA meetings. The patient is making good progress in her recovery and she responded well to this intervention.    Family Program: Family present? No   Name of family member(s):   UDS collected: No Results:   AA/NA attended?: YesMonday, Friday, Saturday and Sunday  Sponsor?: No   Jaylon Boylen, LCAS

## 2011-09-18 ENCOUNTER — Other Ambulatory Visit (HOSPITAL_COMMUNITY): Payer: BC Managed Care – PPO | Admitting: Psychology

## 2011-09-18 DIAGNOSIS — F411 Generalized anxiety disorder: Secondary | ICD-10-CM

## 2011-09-18 MED ORDER — GABAPENTIN 300 MG PO CAPS: 300.0000 mg | ORAL_CAPSULE | Freq: Three times a day (TID) | ORAL | Status: DC

## 2011-09-18 NOTE — Progress Notes (Unsigned)
Patient ID: Laurie Chaney, female   DOB: 10-30-1990, 21 y.o.   MRN: [id number]  Jadda requested to be seen because the pharmacist who met with the IOP group recommended Neurontin as an antianxiety medication to add to the Celexa she was prescribed approx 3 weeks ago.  Pt reports still having trouble going around people, and that makes it difficult to go to 12-step meetings. Discussed with pt need to face some of these problems without first trying to find a pill to fix.  Pt had poor eye contact and an anxious affect.  Plan: Start Neurontin 300mg  tid, with hopes to discontinue when Celexa becomes effective.  Discussed side effects.

## 2011-09-21 ENCOUNTER — Other Ambulatory Visit (HOSPITAL_COMMUNITY): Payer: BC Managed Care – PPO | Admitting: Psychology

## 2011-09-21 NOTE — Progress Notes (Signed)
    Daily Group Progress Note  Program: CD-IOP   Group Time: 1-2:30 pm  Participation Level: Active  Behavioral Response: Appropriate  Type of Therapy: Psycho-education Group  Topic: Stress and Wellness; what do you do to take care of yourself. First half of group spent in presentation on Stress. The physiological aspects of stress were identified and the damage that can result from 'chronic' stress explained. Group members were provided a handout instructing them to identify the things they do on a daily basis to take care of themselves. Members shared their answers in the ensuing discussion. There were significant discrepancies between group members and their self-care.     Group Time: 2:45-4 pm  Participation Level: Active  Behavioral Response: Appropriate and Sharing  Type of Therapy: Process Group  Topic: Group Process; second half of group was spent in process. Members discussed current issues and concerns in early recovery. The group was dismissed early because weather conditions were deteriorating with sleet and snow appearing heavy and roads reportedly becoming more difficult to traverse.    Summary: The patient was attentive and able to identify a number of strategies she uses to deal with stress. She reported she has really been struggling with cravings to smoke 'weed', but reported she had phoned another group member and it had helped to talk with her. She stated it had really helped to talk to her and when they finished their conversation the cravings had passed. The patient reported she had emailed a Buddhist monk here in [place] and met with him yesterday. They had talked about her addiction and he had emphasized the importance of her remaining drug-free and emphasized purity of thought and body. She admitted she was very excited about the experience and is going to be meeting with him periodically to work on her practice. She agreed with another member that living at  home with her parents and brother is difficult and very frustrating and she has spoken to her mother about moving into an [address]. The patient seemed very pleased with herself when she showed off her 60-day key chain she had picked up at a meeting last night. The group applauded this news. She responded well to this intervention.   Family Program: Family present? No   Name of family member(s):   UDS collected: Yes Results: negative  AA/NA attended?: YesTuesday, Thursday and Sunday  Sponsor?: No   Maaliyah Adolph, LCAS

## 2011-09-22 NOTE — Progress Notes (Signed)
    Daily Group Progress Note  Program: CD-IOP   Group Time: 1-2:30 pm  Participation Level: Active  Behavioral Response: Appropriate and Sharing  Type of Therapy: Psycho-education Group  Topic: Affirmations      Group Time: 2:45-4 pm  Participation Level: Active  Behavioral Response: Appropriate and Sharing  Type of Therapy: Process Group  Topic:  Group Process and Graduation: the second half of group was spent in process. Members discussed issues they are currently dealing with in their lives. One member opened up about her eating disorder and this allowed another member to share about her own self-image and eating disorder. Another member passed around pictures of herself before her surgery and subsequent weight loss of over 120 lbs. At the conclusion of the session, a graduation ceremony was held to honor a successfully graduating member. There were kind words and lots of encouragement and validation offered to the graduating member. He shared his hopes about his fellow group members and wished them well.  Summary: the patient admitted that she had a terrible self-esteem and struggles with this often. 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 The patient talked about cutting and noted it was something she had done when she was in her teens. The patient reported she had gone to a Walking Meditation held at the Buddhist temple and it had been a wonderful experience. In process, she shared her struggle with giving up marijuana entirely and insisted on the spiritual experiences she had had during acid trips. I pointed out she frequently 'romanticizes' her drug use and the way acid, ecstacy, and MDMA make her feel. The patient had kind words for the graduating member and she responded well to this intervention.    Family Program: Family present? No   Name of family member(s):   UDS collected: No Results:     AA/NA attended?: YesFriday and Sunday  Sponsor?: No   Estle Sabella, LCAS

## 2011-09-23 ENCOUNTER — Other Ambulatory Visit (HOSPITAL_COMMUNITY): Payer: BC Managed Care – PPO | Admitting: Psychology

## 2011-09-23 DIAGNOSIS — F1920 Other psychoactive substance dependence, uncomplicated: Secondary | ICD-10-CM

## 2011-09-24 LAB — PRESCRIPTION ABUSE MONITORING 17P, URINE
Cannabinoid Scrn, Ur: NEGATIVE ng/mL
Cocaine Metabolites: NEGATIVE ng/mL
MDMA URINE: NEGATIVE ng/mL
Opiate Screen, Urine: NEGATIVE ng/mL
Oxycodone Screen, Ur: NEGATIVE ng/mL
Tapentadol, urine: NEGATIVE ng/mL
Tramadol Scrn, Ur: NEGATIVE ng/mL
Zolpidem, Urine: NEGATIVE ng/mL

## 2011-09-24 NOTE — Progress Notes (Signed)
    Daily Group Progress Note  Program: CD-IOP   Group Time: 1-2:30 pm  Participation Level: Active  Behavioral Response: Appropriate and Sharing  Type of Therapy: Psycho-education Group  Topic:Fighting the Desire to Use; how does one do that? The first half of group was spent talking about early recovery and the struggle to remain abstinent. One member disclosed receiving a text from a friend inviting her to join him in using Ecstasy and MDMA. She cried as she shared about her loneliness in early recovery and how much she missed her friends. She went on to romanticize her experiences using hallucinogens. The group talked about their own cravings and what they have done in the past to refrain from relapsing. There was good disclosure and sharing among group members. Ultimately, the group agreed that one has to decide whether sobriety is important enough to fight for.       Group Time: 2:45-4 pm  Participation Level: Active  Behavioral Response: Minimizing, denial  Type of Therapy: Process Group  Topic: Group Process and Graduation; second half of group spent in process. Members shared about current struggles in early recovery. As the session neared the end, a ceremony was held for a departing group member. Kind words of support and encouragement were offered and the graduating member appeared genuinely touched by her fellow group members' affirmations.    Summary: the patient arrived for group in a cheery mood and reported she was considering seeking entry into an [address]. About 30 minutes later, she appeared upset and reported she had received a text from a friend inviting her to use ecstacy and MDMA. She noted that she missed her 'old life' and friends and couldn't comprehend not smoking marijuana ever again. She became teary and reported she had had wonderful times with her friends getting high over the years and she had thoroughly anticipated smoking marijuana for the rest of  her life. She became up set, questioning the reasoning behind quitting and expressing fear that nothing would matter anyway because her Probation Officer would probably bring up her cannabis positive drug test and make it impossible for her to get her charges dropped. The group spent much of the remainder of the session talking to her and encouraging her to remind herself of the costs of using versus the benefits and experiences she has had being sober. The patient presented as very discouraged and upset and wasn't able to say for sure what she would do later today. Her comments generated lots of conversation and disclosures and it contributed towards making this session an effective one. The patient was honest about her feelings and I applauded this openness. The group provided excellent feedback and gave this patient validation and support for ongoing abstinence efforts.    Family Program: Family present? No   Name of family member(s):   UDS collected: Yes Results: negative  AA/NA attended?: YesMonday, Tuesday and Saturday  Sponsor?: No   Deveon Kisiel, LCAS

## 2011-09-25 ENCOUNTER — Telehealth (HOSPITAL_COMMUNITY): Payer: Self-pay | Admitting: Psychology

## 2011-09-25 ENCOUNTER — Other Ambulatory Visit (HOSPITAL_COMMUNITY): Payer: BC Managed Care – PPO | Attending: Physician Assistant

## 2011-09-25 DIAGNOSIS — L708 Other acne: Secondary | ICD-10-CM | POA: Insufficient documentation

## 2011-09-25 DIAGNOSIS — F411 Generalized anxiety disorder: Secondary | ICD-10-CM | POA: Insufficient documentation

## 2011-09-25 DIAGNOSIS — Z87440 Personal history of urinary (tract) infections: Secondary | ICD-10-CM | POA: Insufficient documentation

## 2011-09-25 DIAGNOSIS — R51 Headache: Secondary | ICD-10-CM | POA: Insufficient documentation

## 2011-09-25 DIAGNOSIS — F1920 Other psychoactive substance dependence, uncomplicated: Secondary | ICD-10-CM | POA: Insufficient documentation

## 2011-09-25 DIAGNOSIS — J019 Acute sinusitis, unspecified: Secondary | ICD-10-CM | POA: Insufficient documentation

## 2011-09-28 ENCOUNTER — Other Ambulatory Visit (HOSPITAL_COMMUNITY): Payer: BC Managed Care – PPO | Admitting: Psychology

## 2011-09-28 DIAGNOSIS — F1920 Other psychoactive substance dependence, uncomplicated: Secondary | ICD-10-CM

## 2011-09-29 LAB — PRESCRIPTION ABUSE MONITORING 17P, URINE
Amphetamine/Meth: NEGATIVE ng/mL
Buprenorphine, Urine: NEGATIVE ng/mL
Carisoprodol, Urine: NEGATIVE ng/mL
Cocaine Metabolites: NEGATIVE ng/mL
Creatinine, Urine: 86.36 mg/dL
Methadone Screen, Urine: NEGATIVE ng/mL
Opiate Screen, Urine: NEGATIVE ng/mL
Oxycodone Screen, Ur: NEGATIVE ng/mL
Tapentadol, urine: NEGATIVE ng/mL
Tramadol Scrn, Ur: NEGATIVE ng/mL

## 2011-09-29 LAB — ALCOHOL METABOLITE (ETG), URINE: Ethyl Glucuronide (EtG): NEGATIVE ng/mL

## 2011-09-29 NOTE — Progress Notes (Signed)
    Daily Group Progress Note  Program: CD-IOP   Group Time: 1-2:30 pm  Participation Level: Active  Behavioral Response: Appropriate and Sharing  Type of Therapy: Process Group  Topic: Group Process; first half of group was spent in process. Members shared issues and concerns that were currently troubling them. There was good feedback and disclosure among the members.      Group Time: 2:45-4 pm  Participation Level: Active  Behavioral Response: Appropriate and Sharing  Type of Therapy: Psycho-education Group  Topic:The Progressive Disease of Addiction:  a presentation was provided identifying the progressive nature of addiction. A handout accompanied the presentation and group members participated in identifying their own experiences with the symptoms listed on the handout. All eight members were able to concur with the symptoms almost down to the bottom of the progression, which included "complete abandonment".  The insanity of continued efforts to hide use despite the obvious changes in personhood was acknowledged by almost everyone.     Summary: The patient reported she had had a terrible weekend and went on to report that her mother had lost her job on Friday. 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 Tamekia reminded the group that her father had lost his job about 6 weeks ago. She expressed concern about her mother and whether she might drift back into her addiction to narcotic pain meds. She reported she was very worried about her mother, who had cried for much of the weekend. The patient talked about her struggle accepting her addiction and the idea that she should remain free of all drugs forever. She admitted that she had always seen herself getting high in the future and this kind of blew apart her entire thoughts about her future. She described how she can more clearly see her life and patterns after more than 2 months of sobriety. She made some  excellent comments and displays a very clear understanding about her own relationship with chemicals. I applauded this group member and pointed out to her fellow group members that she has been extremely honest about her addiction. This has allowed her to see herself more clearly and make accurate conclusions as a result of her openness. I encouraged the others to be as courageous as this young woman has been.    Family Program: Family present? No   Name of family member(s):   UDS collected: Yes Results: not back yet  AA/NA attended?: YesSaturday and Sunday  Sponsor?: No   Judithe Keetch, LCAS

## 2011-09-30 ENCOUNTER — Other Ambulatory Visit (HOSPITAL_COMMUNITY): Payer: Self-pay | Admitting: Physician Assistant

## 2011-09-30 ENCOUNTER — Other Ambulatory Visit (HOSPITAL_COMMUNITY): Payer: BC Managed Care – PPO | Admitting: Psychology

## 2011-09-30 DIAGNOSIS — F4010 Social phobia, unspecified: Secondary | ICD-10-CM

## 2011-09-30 MED ORDER — CITALOPRAM HYDROBROMIDE 20 MG PO TABS: 20.0000 mg | ORAL_TABLET | Freq: Every day | ORAL | Status: DC

## 2011-10-01 ENCOUNTER — Encounter (HOSPITAL_COMMUNITY): Payer: Self-pay | Admitting: Psychology

## 2011-10-01 NOTE — Progress Notes (Signed)
Patient ID: Laurie Chaney, female   DOB: [birth year], 21 y.o.   MRN: [id number] Discharge Plan: Met with the patient this afternoon. She has made excellent progress in this program and her recovery and will be discharged successfully on Monday, the 11th. Today we reviewed her goals for treatment and she has done well. She has remained drug and alcohol-free since before she entered the program, has begun attending 12-step meetings and has a temporary sponsor. She has remained compliant with her probation officer and [employer] case manager. The patient entered this program very resistant and angry for having to be here. Today she admitted she has found it very comforting and helpful and really enjoys the group. I noted how she has opened up and shared more about her life as she has progressed through the process. She started from a strong position of resistance, but now she has an excellent understanding of recovery and is very accepting of her chemical dependency status. She stated she would return to Dr. Evelene Croon for psychiatric monitoring once she leaves the program. She is currently prescribed Celexa by the medical director, Jorje Guild, but yesterday in group she complained about the sexual side effects and will speak with him tomorrow before she is discharged. She has embraced her Buddhist interests more intently and has been to the [facility] here in [place] and recognizes the conflict with her drug use and a daily meditation practice. The patient will complete the program successfully on Monday and be provided with documentation to provide to the PO. The patient has done extremely well and seems committed to her new abstinence-based lifestyle.

## 2011-10-01 NOTE — Progress Notes (Signed)
    Daily Group Progress Note  Program: CD-IOP   Group Time: 1-2:30 pm  Participation Level: Active  Behavioral Response: Appropriate and Sharing  Type of Therapy: Psycho-education Group  Topic: Second half of session spent in process. It was so warm the group voted to sit out in the courtyard. The group pulled up 2 picnic tables and shared about current issues. The new member introduced himself and described his descent into alcoholism. Another shared that she had broken up with her boyfriend and although she felt it was right, she was, admittedly, struggling. There was good feedback and members felt 'heard'.      Group Time: 2:45-4 pm  Participation Level: Active  Behavioral Response: Appropriate and Sharing  Type of Therapy: Process Group  Topic: Second half of session spent in process. It was so warm the group voted to sit out in the courtyard. The group pulled up 2 picnic tables and shared about current issues. The new member introduced himself and described his descent into alcoholism. Another shared that she had broken up with her boyfriend and although she felt it was right, she was, admittedly, struggling. There was good feedback and members felt 'heard'.    Summary: patient shared her 'wheel' with the group. She continues to be animated and more talkative and admitted that she feels very comfortable in group and it feels good to share. She talked about her wheel and reported she was hopeful about the future. She intends to be completed with all of her legal issues come early April and plans to move to [place] with her boyfriend afterwards. The patient shared that she is happy he is serious about recovery and noted he will be entering [facility], the residential facility in [facility] for about 28 days. She complained about the side effects of her Celexa, specifically the sexual side effects and expressed frustration about her lack of libido and the importance of sexuality in  her life. She was pleased to hear she would be able to speak with the medical director on Friday. The patient talked about yoga and invited her fellow group members to attend a yoga class with her. She also shared intentions to attend a meditation on Friday evening at the [facility] [place] here in [place]. The patient is making excellent progress in her recovery and responded well to this intervention.    Family Program: Family present? No   Name of family member(s):   UDS collected: No Results:   AA/NA attended?: YesMonday, Tuesday, Saturday and Sunday  Sponsor?: Yes - temporary   [facility] [employer]

## 2011-10-02 ENCOUNTER — Other Ambulatory Visit (HOSPITAL_COMMUNITY): Payer: BC Managed Care – PPO

## 2011-10-02 DIAGNOSIS — F411 Generalized anxiety disorder: Secondary | ICD-10-CM

## 2011-10-02 MED ORDER — ESCITALOPRAM OXALATE 10 MG PO TABS: 10.0000 mg | ORAL_TABLET | Freq: Every day | ORAL | Status: DC

## 2011-10-02 NOTE — Progress Notes (Unsigned)
   [facility] Behavioral Health Follow-up Outpatient Visit  BINTOU LAFATA [birth year]  Date: 10/02/11   Subjective: The Tarryn requested this visit due to side effects of the Celexa. She reports that her libido is significantly decreased, and that she is having difficulty achieving an orgasm. She is disappointed because she feels that the Celexa has worked well to reduce her anxiety. She has begun to meet people in in a meetings, and is getting telephone numbers of women. She is excited about expanding her support network. She reports that her energy is good; she is sleeping and eating well; she denies any depression.  There were no vitals filed for this visit.  Mental Status Examination  Appearance: Well groomed and casually dressed Alert: Yes Attention: good  Cooperative: Yes Eye Contact: Fair Speech: Clear and even Psychomotor Activity: Increased Memory/Concentration: Intact Oriented: person, place, time/date and situation Mood: Euthymic Affect: Congruent Thought Processes and Associations: Goal Directed and Linear Fund of Knowledge: Good Thought Content:  Insight: Fair Judgement: Fair  Diagnosis: Generalized anxiety disorder  Treatment Plan: We will switch her from Celexa 20 mg to Lexapro 10 mg and follow.  [facility]

## 2011-10-05 ENCOUNTER — Encounter (HOSPITAL_COMMUNITY): Payer: BC Managed Care – PPO | Attending: Physician Assistant | Admitting: Psychology

## 2011-10-06 NOTE — Progress Notes (Signed)
    Daily Group Progress Note  Program: CD-IOP   Group Time: 1-2:30 pm  Participation Level: Active  Behavioral Response: Appropriate and Sharing  Type of Therapy: Psycho-education Group  Topic: Group Process: second half of group spent in process. Members discussed issues or concerns that they were currently struggling with. The group had a PA from [facility] sitting in today and she introduced herself. This brought up a good discussion and details of her life, including her struggles with Type 1 Diabetes.       Group Time: 2:45- 4pm  Participation Level: Active  Behavioral Response: Appropriate and Sharing  Type of Therapy: Process Group  Topic: Group Process: second half of group spent in process. Members discussed issues or concerns that they were currently struggling with. The group had a PA from [facility] sitting in today and she introduced herself. This brought up a good discussion and details of her life, including her struggles with Type 1 Diabetes.     Summary: The patient was very engaged in the discussion with the chaplain and shared her different practices, including meditation and dance. She also put together collages and had found them very rewarding and meaningful. In process, the patient talked about her plans now that she is completing this program today. She noted that she had dropped her boyfriend off at the treatment center [facility] - where he will be for another month. She planned on looking for a job and practicing her meditation and experiences at the [employer] here in [place]. The patient received wonderful and validating feedback from her fellow group members as the graduation ceremony was held. She thanked the group for their support and agreed that she had made good changes during her time here. The patient has done very well in this program and made some dramatic changes in these past 2 months. She responded well to this intervention and has  successfully completed the CD-IOP.    Family Program: Family present? No   Name of family member(s):   UDS collected: No ResultsFindings; urine drug screen:[id number]}{  AA/NA attended?: YesTuesday, Thursday, Saturday and Sunday  Sponsor?: Yes   Lashya Passe, LCAS

## 2011-10-23 ENCOUNTER — Other Ambulatory Visit (HOSPITAL_COMMUNITY): Payer: Self-pay | Admitting: Physician Assistant

## 2011-10-23 DIAGNOSIS — F411 Generalized anxiety disorder: Secondary | ICD-10-CM

## 2011-10-23 MED ORDER — GABAPENTIN 300 MG PO CAPS: 300.0000 mg | ORAL_CAPSULE | Freq: Three times a day (TID) | ORAL | Status: DC

## 2011-11-23 ENCOUNTER — Ambulatory Visit (HOSPITAL_COMMUNITY): Payer: Self-pay | Admitting: Physician Assistant

## 2011-11-27 ENCOUNTER — Other Ambulatory Visit (HOSPITAL_COMMUNITY): Payer: Self-pay | Admitting: Physician Assistant

## 2011-11-27 ENCOUNTER — Encounter (HOSPITAL_COMMUNITY): Payer: Self-pay | Admitting: Psychology

## 2011-11-27 DIAGNOSIS — F411 Generalized anxiety disorder: Secondary | ICD-10-CM

## 2011-11-27 MED ORDER — ESCITALOPRAM OXALATE 10 MG PO TABS: 10.0000 mg | ORAL_TABLET | Freq: Every day | ORAL | Status: DC

## 2011-11-30 ENCOUNTER — Other Ambulatory Visit (HOSPITAL_COMMUNITY): Payer: Self-pay | Admitting: *Deleted

## 2011-11-30 ENCOUNTER — Telehealth (HOSPITAL_COMMUNITY): Payer: Self-pay | Admitting: *Deleted

## 2011-11-30 DIAGNOSIS — F411 Generalized anxiety disorder: Secondary | ICD-10-CM

## 2011-11-30 MED ORDER — GABAPENTIN 300 MG PO CAPS: 300.0000 mg | ORAL_CAPSULE | Freq: Three times a day (TID) | ORAL | Status: DC

## 2012-01-20 ENCOUNTER — Ambulatory Visit (HOSPITAL_COMMUNITY): Payer: Self-pay | Admitting: Physician Assistant

## 2012-01-21 ENCOUNTER — Telehealth (HOSPITAL_COMMUNITY): Payer: Self-pay | Admitting: *Deleted

## 2012-01-21 DIAGNOSIS — F411 Generalized anxiety disorder: Secondary | ICD-10-CM

## 2012-01-21 MED ORDER — ESCITALOPRAM OXALATE 10 MG PO TABS: 10.0000 mg | ORAL_TABLET | Freq: Every day | ORAL | Status: DC

## 2012-01-21 MED ORDER — GABAPENTIN 300 MG PO CAPS: 300.0000 mg | ORAL_CAPSULE | Freq: Three times a day (TID) | ORAL | Status: DC

## 2012-01-21 NOTE — Telephone Encounter (Signed)
Patient  had  "New Patient" appointments on 11/23/11 and 01/20/12  rescheduled due to provider illness. She was seen previously by the provider while part of an Intensive Outpatient program here at [facility], and was prescribed medications. Due to rescheduling of appointments, patient will run out of medicine before next appointment 03/01/12.Marland Kitchen

## 2012-03-01 ENCOUNTER — Ambulatory Visit (HOSPITAL_COMMUNITY): Payer: Self-pay | Admitting: Physician Assistant

## 2012-07-14 ENCOUNTER — Ambulatory Visit (HOSPITAL_COMMUNITY)
Admission: RE | Admit: 2012-07-14 | Discharge: 2012-07-14 | Disposition: A | Discharge status: Home / Self Care | Payer: BC Managed Care – PPO | Attending: Psychiatry | Admitting: Psychiatry

## 2012-07-14 DIAGNOSIS — F411 Generalized anxiety disorder: Secondary | ICD-10-CM | POA: Insufficient documentation

## 2012-07-14 DIAGNOSIS — F1320 Sedative, hypnotic or anxiolytic dependence, uncomplicated: Secondary | ICD-10-CM | POA: Insufficient documentation

## 2012-07-14 DIAGNOSIS — F1220 Cannabis dependence, uncomplicated: Secondary | ICD-10-CM | POA: Insufficient documentation

## 2012-07-14 NOTE — BH Assessment (Signed)
Assessment Note   Laurie Chaney is an 21 y.o. female. Pt presents with appointment for assessment for CD IOP and has start date of 07/15/2012. Pt has depression, anxiety disorder being treated by Janace Hoard MD. Pt has many losses due to her relapse on [facility] and benzodiazepines; lost job in September, lost housing and her relationship with boyfriend11/16/2013. Pt reports several black outs and increased panic symptoms over past week. Appears motivated for treatment. Alert, oriented x 4, well groomed,anxious with broad affect, denies SI, HI and psychosis Pt had inpatient treatment at [facility], she attended IOP and stayed sober and straight until June. Pt had a boyfriend in early recovery also, they relapsed together on THC. Pt plans to attend IOP !!/22/2013.  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 Acne   . Migraine   . Anxiety   . Seizures     had 6 none since 4th grade  . Depression     hx of wellbutrin and lexapro use  . History of recurrent UTIs     as a child had evaluation  . History of varicella   . Overdose 07/23/2011  . Respiratory failure, acute 07/23/2011   Axis IV: economic problems, housing problems, occupational problems, other psychosocial or environmental problems and problems related to social environment Axis V: 41-50 serious symptoms  Past Medical History:  Past Medical History  Diagnosis Date  . Acne   . Migraine   . Anxiety   . Seizures     had 6 none since 4th grade  . Depression     hx of wellbutrin and lexapro use  . History of recurrent UTIs     as a child had evaluation  . History of varicella   . Overdose 07/23/2011  . Respiratory failure, acute 07/23/2011    Past Surgical History  Procedure Date  . Tympanostomy tube placement     age 76-2    Family History:  Family History  Problem Relation Age of Onset  . Hypertension Mother   . Depression  Mother   . Anxiety disorder Mother     Social History:  reports that she has never smoked. She does not have any smokeless tobacco history on file. She reports that she drinks alcohol. She reports that she does not use illicit drugs.  Additional Social History:  Alcohol / Drug Use Pain Medications: not abusing Prescriptions: abusing benzodiazapines for about a year Over the Counter: not abusing History of alcohol / drug use?: Yes Substance #1 Name of Substance 1: benzodiazapines 1 - Age of First Use: 20 1 - Amount (size/oz): 10 mg klonipin 1 - Frequency: daily 1 - Duration: 2 weeks 1 - Last Use / Amount: 11/16 Substance #2 Name of Substance 2: marijuana 2 - Age of First Use: 14 2 - Amount (size/oz): 1 Gm day 2 - Frequency: daily 2 - Duration:  since June 2013 2 - Last Use / Amount: 07/14/2012  CIWA: CIWA-Ar Nausea and Vomiting: no nausea and no vomiting Tactile Disturbances: none Tremor: no tremor Auditory Disturbances: not present Paroxysmal Sweats: no sweat visible Visual Disturbances: not present Anxiety: mildly anxious Headache, Fullness in Head: none present Agitation: normal activity Orientation and Clouding of Sensorium: oriented and can do serial additions CIWA-Ar Total: 1  COWS:    Allergies: No Known Allergies  Home Medications:  (Not in a hospital admission)  OB/GYN Status:  No  LMP recorded.  General Assessment Data Location of Assessment: [facility] Assessment Services Living Arrangements: Parent Can pt return to current living arrangement?: Yes Admission Status: Voluntary Is patient capable of signing voluntary admission?: Yes Transfer from: Home Referral Source: Self/Family/Friend  Education Status Is patient currently in school?: No  Risk to self Suicidal Ideation: No Suicidal Intent: No Is patient at risk for suicide?: No Suicidal Plan?: No Access to Means: No What has been your use of drugs/alcohol within the last 12 months?: THC,  benzodiazapines Previous Attempts/Gestures: No Intentional Self Injurious Behavior: None Family Suicide History: Unknown Recent stressful life event(s): Loss (Comment);Conflict (Comment);Job Loss;Financial Problems Persecutory voices/beliefs?: No Depression: Yes Depression Symptoms: Tearfulness;Fatigue;Guilt Substance abuse history and/or treatment for substance abuse?: Yes  Risk to Others Homicidal Ideation: No Thoughts of Harm to Others: No Current Homicidal Intent: No Current Homicidal Plan: No Access to Homicidal Means: No History of harm to others?: No Assessment of Violence: None Noted Does patient have access to weapons?: No Criminal Charges Pending?: No Does patient have a court date: No  Psychosis Hallucinations: None noted Delusions: None noted  Mental Status Report Appear/Hygiene: Meticulous Eye Contact: Good Motor Activity: Restlessness Speech: Logical/coherent Level of Consciousness: Alert Mood: Anxious Affect: Appropriate to circumstance Anxiety Level: Minimal Thought Processes: Coherent;Relevant Judgement: Impaired Orientation: Person;Place;Time;Situation Obsessive Compulsive Thoughts/Behaviors: None  Cognitive Functioning Concentration: Normal Memory: Recent Intact;Remote Intact IQ: Average Insight: Fair Impulse Control: Fair Appetite: Fair Weight Loss: 0  Weight Gain: 0  Sleep: No Change Vegetative Symptoms: None  ADLScreening [facility] Assessment Services) Patient's cognitive ability adequate to safely complete daily activities?: Yes Patient able to express need for assistance with ADLs?: Yes Independently performs ADLs?: Yes (appropriate for developmental age)  Abuse/Neglect [facility]) Physical Abuse: Denies Verbal Abuse: Denies Sexual Abuse: Denies  Prior Inpatient Therapy Prior Inpatient Therapy: Yes Prior Therapy Dates: 11/13 Prior Therapy Facilty/Provider(s): [facility] Reason for Treatment: SA  Prior Outpatient Therapy Prior Outpatient  Therapy: Yes Prior Therapy Dates: several years, current Prior Therapy Facilty/Provider(s): Janace Hoard MD Reason for Treatment: SA anxiety  ADL Screening (condition at time of admission) Patient's cognitive ability adequate to safely complete daily activities?: Yes Patient able to express need for assistance with ADLs?: Yes Independently performs ADLs?: Yes (appropriate for developmental age) Weakness of Legs: None Weakness of Arms/Hands: None  Home Assistive Devices/Equipment Home Assistive Devices/Equipment: None    Abuse/Neglect Assessment (Assessment to be complete while patient is alone) Physical Abuse: Denies Verbal Abuse: Denies Sexual Abuse: Denies Exploitation of patient/patient's resources: Denies Self-Neglect: Denies     Merchant navy officer (For Healthcare) Advance Directive: Patient does not have advance directive Pre-existing out of facility DNR order (yellow form or pink MOST form): No Nutrition Screen- [facility] Adult/WL/AP Patient's home diet: Regular Have you recently lost weight without trying?: No Have you been eating poorly because of a decreased appetite?: No Malnutrition Screening Tool Score: 0   Additional Information 1:1 In Past 12 Months?: No CIRT Risk: No Elopement Risk: No Does patient have medical clearance?: No     Disposition:  Disposition Disposition of Patient: Outpatient treatment 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

## 2012-07-15 ENCOUNTER — Encounter (HOSPITAL_COMMUNITY): Payer: Self-pay | Admitting: Psychology

## 2012-07-15 ENCOUNTER — Other Ambulatory Visit (HOSPITAL_COMMUNITY): Payer: BC Managed Care – PPO | Attending: Psychiatry | Admitting: Psychology

## 2012-07-15 DIAGNOSIS — F1220 Cannabis dependence, uncomplicated: Secondary | ICD-10-CM | POA: Insufficient documentation

## 2012-07-15 DIAGNOSIS — F1310 Sedative, hypnotic or anxiolytic abuse, uncomplicated: Secondary | ICD-10-CM | POA: Insufficient documentation
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
## 2012-07-18 ENCOUNTER — Other Ambulatory Visit (HOSPITAL_COMMUNITY): Payer: BC Managed Care – PPO | Admitting: Psychology

## 2012-07-18 DIAGNOSIS — F1220 Cannabis dependence, uncomplicated: Secondary | ICD-10-CM

## 2012-07-20 ENCOUNTER — Other Ambulatory Visit (HOSPITAL_COMMUNITY): Payer: BC Managed Care – PPO

## 2012-07-22 ENCOUNTER — Other Ambulatory Visit (HOSPITAL_COMMUNITY): Payer: BC Managed Care – PPO

## 2012-07-25 ENCOUNTER — Other Ambulatory Visit (HOSPITAL_COMMUNITY): Payer: BC Managed Care – PPO | Attending: Psychiatry | Admitting: Psychology

## 2012-07-25 DIAGNOSIS — F1920 Other psychoactive substance dependence, uncomplicated: Secondary | ICD-10-CM

## 2012-07-25 DIAGNOSIS — F1310 Sedative, hypnotic or anxiolytic abuse, uncomplicated: Secondary | ICD-10-CM | POA: Insufficient documentation

## 2012-07-25 DIAGNOSIS — F1220 Cannabis dependence, uncomplicated: Secondary | ICD-10-CM | POA: Insufficient documentation

## 2012-07-26 ENCOUNTER — Encounter (HOSPITAL_COMMUNITY): Payer: Self-pay | Admitting: Psychology

## 2012-07-26 LAB — PRESCRIPTION ABUSE MONITORING 17P, URINE
Barbiturate Screen, Urine: NEGATIVE ng/mL
Buprenorphine, Urine: NEGATIVE ng/mL
Carisoprodol, Urine: NEGATIVE ng/mL
Cocaine Metabolites: NEGATIVE ng/mL
Fentanyl, Ur: NEGATIVE ng/mL
Opiate Screen, Urine: NEGATIVE ng/mL
Oxycodone Screen, Ur: NEGATIVE ng/mL
Tramadol Scrn, Ur: NEGATIVE ng/mL

## 2012-07-26 LAB — ALCOHOL METABOLITE (ETG), URINE: Ethyl Glucuronide (EtG): NEGATIVE ng/mL
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
## 2012-07-27 ENCOUNTER — Other Ambulatory Visit (HOSPITAL_COMMUNITY): Payer: BC Managed Care – PPO | Admitting: Psychology

## 2012-07-27 DIAGNOSIS — F1220 Cannabis dependence, uncomplicated: Secondary | ICD-10-CM

## 2012-07-27 LAB — AMPHETAMINES (GC/LC/MS), URINE
Amphetamine GC/MS Conf: NEGATIVE ng/mL
MDA GC/MS confirm: NEGATIVE ng/mL
MDEA GC/MS Conf: NEGATIVE ng/mL
MDMA GC/MS Conf: NEGATIVE ng/mL
Methamphetamine Quant, Ur: NEGATIVE ng/mL

## 2012-07-28 ENCOUNTER — Encounter (HOSPITAL_COMMUNITY): Payer: Self-pay | Admitting: Psychology

## 2012-07-28 NOTE — Progress Notes (Signed)
Daily Group Progress Note  Program: CD-IOP   Group Time: 1-2:30 pm  Participation Level: Active  Behavioral Response: Appropriate and Sharing  Type of Therapy: Process Group  Topic: Group Process: first part of group spent in process. Members shared about current issues and concerns. There were 2 group members who had not shared about their own lives and struggles and during this session they introduced themselves. The results of the drug tests collected on Monday were shared. One member admitted she had drunk alcohol over the weekend, but had failed to disclose this on Monday. There was a long discussion among members who expressed their concerns about this group member and her continued drinking despite serious liver disease. The group provided very validating feedback and support to this woman. This proved to be a very intense session with the focus going beyond the drinking to one's deeper sense of worth and purpose in the world  Group Time: 2:45- 4pm  Participation Level: Active  Behavioral Response: Rationalizing  Type of Therapy: Psycho-education Group  Topic: Denial and other Cognitive Distortions: second half of group was spent in the ongoing discussion from the earlier session, but there was a handout and discussion on different forms of denial and cognitive distortions. Members were asked to identify examples of how they have rationalized, minimized, and distracted while they were in their active addictions. As the session neared the end, members were asked what they would be doing to support their recovery between now and the next session. While some reported they would be attending 12-step meetings, others used some of the previously mentioned distortions while others admitted they would not be doing much of anything.   Summary: The patient reported she had attended a meeting on Monday evening and the meditated for almost 3 hours yesterday morning. She reported having had  dinner with her parents last night, but when asked about attending any additional meetings, she admitted she had not gone to any more and agreed that she was just making excuses. When asked why she had not thought to do something pro-active to address or combat her addiction, she really had no explanation. I confronted this patient with documentation about prescriptions filled outside of this program. I noted that she had picked up a klonipin script on 11/12. She admitted she had sold it. I asked her to recount what had occurred last year when she was arrested and charged with a felony for giving a friend one of her klonipin pills? The patient disclosed the many legal problems she had faced, but how the charge had been expunged when she completed all of her legal requirements. The group expressed surprise that she has, once again risked felony charges by selling her medications. The patient was very upset to hear about her fellow group member's drinking and her, seeming, loss of hope about her life. This patient raved about how wonderful this woman was and how much she admired her. She agreed that they would spent time together and that she must phone her if she is feeling down. The patient readily identified many of the cognitive distortions and types of denial that she had used when confronted by her parents. The patient reported she would go to 12-step meetings between now and the next group session. Her sobriety date remains 12/1.   Family Program: Family present? No   Name of family member(s):   UDS collected: No Results:   AA/NA attended?: [place]  Sponsor?: No   Clement Deneault, LCAS
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
## 2012-07-29 ENCOUNTER — Other Ambulatory Visit (HOSPITAL_COMMUNITY): Payer: BC Managed Care – PPO | Admitting: Psychology

## 2012-07-29 ENCOUNTER — Encounter (HOSPITAL_COMMUNITY): Payer: Self-pay | Admitting: Psychology

## 2012-07-29 DIAGNOSIS — F1220 Cannabis dependence, uncomplicated: Secondary | ICD-10-CM

## 2012-07-31 ENCOUNTER — Encounter (HOSPITAL_COMMUNITY): Payer: Self-pay | Admitting: Psychology

## 2012-07-31 NOTE — Progress Notes (Signed)
    Daily Group Progress Note  Program: CD-IOP   Group Time: 1-2:30 pm  Participation Level: Active  Behavioral Response: Appropriate and Sharing  Type of Therapy: Process Group  Topic: Group Process: first half of group session was spent in process. Members shared about current issues and concerns in early recovery. One member shared about her decision to accept the recommendation for inpatient treatment. In the previous session she had admitted relapsing again over the past weekend. She knew that a higher level of care would be recommended if she relapsed again. Members provided feedback and discussed treatment options. There was good disclosure and comments throughout the session.  Group Time: 2:45- 4pm  Participation Level: Active  Behavioral Response: Sharing  Type of Therapy: Psycho-education Group  Topic: Communication: second half of group was spent discussing "Assertive Communication". Members were encouraged to address feelings as they arise and not hold them in or dismiss them. Emphasis included the problems that arise as one continues to discount or negate their feelings and how this leads to spontaneous "blow-ups" that are inappropriate and painful for all involved. Included was a discussion on how, in the previous session, one member could have shared her feelings with another member instead of letting them fester and responding aggressively later on in the same session. There was good feedback, but members still display hesitancy to be assertive as they associate this with arguments and ill feelings.   Summary: The patient reported she had attended a meeting on Wednesday evening and Thursday morning as well. She reported she feels better today and her sobriety date remains 12/1. The patient provided good feedback and support to the fellow group member who has agreed to go to treatment. She also encouraged another member who admitted having drunk a beer the night before. She  agreed that once one enters into treatment and recovery, using or relapsing is never as enjoyable because there is this underlying guilt. In the second half of group, the patient admitted that she is very passive and rarely ever displays assertiveness. In fact, I pointed out she is probably on aggressive to herself. The patient stated she would be attending meetings this weekend and noted she was going to Zumba with another group member and then having dinner with her. She seemed excited to have plans with others in recovery.    Family Program: Family present? No   Name of family member(s):   UDS collected: No Results:   AA/NA attended?: YesWednesday and Thursday  Sponsor?: No   Keison Glendinning, LCAS

## 2012-08-01 ENCOUNTER — Encounter (HOSPITAL_COMMUNITY): Payer: Self-pay | Admitting: Psychology

## 2012-08-01 ENCOUNTER — Other Ambulatory Visit (HOSPITAL_COMMUNITY): Payer: BC Managed Care – PPO | Admitting: Psychology

## 2012-08-01 DIAGNOSIS — F1220 Cannabis dependence, uncomplicated: Secondary | ICD-10-CM
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
## 2012-08-03 ENCOUNTER — Other Ambulatory Visit (HOSPITAL_COMMUNITY): Payer: BC Managed Care – PPO | Admitting: Psychology

## 2012-08-03 DIAGNOSIS — F1920 Other psychoactive substance dependence, uncomplicated: Secondary | ICD-10-CM

## 2012-08-03 DIAGNOSIS — F1220 Cannabis dependence, uncomplicated: Secondary | ICD-10-CM

## 2012-08-04 LAB — PRESCRIPTION ABUSE MONITORING 17P, URINE
Amphetamine/Meth: NEGATIVE ng/mL
Carisoprodol, Urine: NEGATIVE ng/mL
Cocaine Metabolites: NEGATIVE ng/mL
MDMA URINE: NEGATIVE ng/mL
Methadone Screen, Urine: NEGATIVE ng/mL
Opiate Screen, Urine: NEGATIVE ng/mL
Oxycodone Screen, Ur: NEGATIVE ng/mL
Tapentadol, urine: NEGATIVE ng/mL
Tramadol Scrn, Ur: NEGATIVE ng/mL
Zolpidem, Urine: NEGATIVE ng/mL

## 2012-08-05 ENCOUNTER — Encounter (HOSPITAL_COMMUNITY): Payer: Self-pay | Admitting: Psychology

## 2012-08-05 ENCOUNTER — Other Ambulatory Visit (HOSPITAL_COMMUNITY): Payer: BC Managed Care – PPO | Admitting: Psychology

## 2012-08-05 DIAGNOSIS — F1220 Cannabis dependence, uncomplicated: Secondary | ICD-10-CM

## 2012-08-05 LAB — CANNABANOIDS (GC/LC/MS), URINE: THC-COOH (GC/LC/MS), ur confirm: 423 ng/mL

## 2012-08-05 NOTE — Progress Notes (Signed)
    Daily Group Progress Note  Program: CD-IOP   Group Time: 1-2:30 pm  Participation Level: Active  Behavioral Response: Sharing  Type of Therapy: Psychoeducation  Topic: Triggers: External and Internal; What are yours? The first part of group included a check-in and a psycho educational piece. The session was spent educating about triggers, identifying "your triggers" and how to eliminate and/or address them. Members were easily able to identify both types of triggers. Physical triggers included money, [employer], drug dealers, weekends, and friends. Internal triggers included every kind of emotion and some of the newer members were not familiar with the differences. The session proved informative and members clearly gained from this discussion  Group Time: 2:45- 4pm  Participation Level: Active  Behavioral Response: Appropriate and Sharing  Type of Therapy: Process Group  Topic: Group Process/Introduction: The second part of group was spent in process. Members shared about their current issues and struggles in early recovery. Members shared about their attendance, or lack of attendance, in 12-step meetings. There was good disclosure among the group and the feedback and support was welcomed.  Summary: The patient reported she had attended 3 12-step meetings since the last group session. For this, she received applause from the group. She remains drug-free with a new sobriety date of 12/7. She reported that friends were a big trigger, but she knows few people who don't use. She described stress, boredom, and depression as big internal triggers. She smiled as the new group member recounted high school as a time of drug use which included hallucinogens, cannabis and benzos. The patient had little to offer about plans once she loses her apartment at the end of this month. When I admitted encouraging her to enter a therapeutic community of a year or more, the patient reported she had only  resisted that because of her cat. She admitted that she didn't know what would happen to the cat and she didn't want to leave it. Other members, though respectful, clearly discounted this 'excuse'. the patient good support and feedback to her fellow group members, but she continues to fail to generate the same compassion and determination for herself.  Family Program: Family present? No   Name of family member(s):   UDS collected: Yes Results: positive for marijuana  AA/NA attended?: YesMonday and Tuesday  Sponsor?: No   Twilla Khouri, LCAS

## 2012-08-08 ENCOUNTER — Other Ambulatory Visit (HOSPITAL_COMMUNITY): Payer: BC Managed Care – PPO | Admitting: Psychology

## 2012-08-08 ENCOUNTER — Encounter (HOSPITAL_COMMUNITY): Payer: Self-pay | Admitting: Psychology

## 2012-08-08 NOTE — Addendum Note (Signed)
Addended by: Shon Millet on: 08/08/2012 10:06 AM   Modules accepted: Orders
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
## 2012-08-10 ENCOUNTER — Other Ambulatory Visit (HOSPITAL_COMMUNITY): Payer: BC Managed Care – PPO | Admitting: Psychology

## 2012-08-10 DIAGNOSIS — F1220 Cannabis dependence, uncomplicated: Secondary | ICD-10-CM

## 2012-08-11 ENCOUNTER — Encounter (HOSPITAL_COMMUNITY): Payer: Self-pay | Admitting: Psychology
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
## 2012-08-12 ENCOUNTER — Other Ambulatory Visit (HOSPITAL_COMMUNITY): Payer: BC Managed Care – PPO

## 2012-08-15 ENCOUNTER — Other Ambulatory Visit (HOSPITAL_COMMUNITY): Payer: BC Managed Care – PPO | Admitting: Psychology

## 2012-08-15 DIAGNOSIS — F1320 Sedative, hypnotic or anxiolytic dependence, uncomplicated: Secondary | ICD-10-CM

## 2012-08-15 DIAGNOSIS — F1220 Cannabis dependence, uncomplicated: Secondary | ICD-10-CM

## 2012-08-17 ENCOUNTER — Other Ambulatory Visit (HOSPITAL_COMMUNITY): Payer: BC Managed Care – PPO

## 2012-08-18 ENCOUNTER — Encounter (HOSPITAL_COMMUNITY): Payer: Self-pay | Admitting: Psychology

## 2012-08-18 NOTE — Progress Notes (Signed)
    Daily Group Progress Note  Program: CD-IOP   Group Time: 1-2:30 pm  Participation Level: Active  Behavioral Response: Appropriate and Sharing  Type of Therapy: Process Group  Topic: Group Process: First part of group was spent in process. Members shared about current issues and concerns. One member shared about her brother and their relationship. Another member disclosed having a blow-up with his mother. Another member shared that her husband seems more supportive of her recovery after having met with him and me for a session. The 2 female members present both noted that their "good to" emotion is anger. The group discussed what emotion might be underlying the anger. Members were encouraged not to re-enact their adolescence.There was good disclosure and feedback among the group.   Group Time: 2:45- 4pm  Participation Level: Active  Behavioral Response: Sharing, minimizing and rationalizing  Type of Therapy: Psycho-education Group  Topic: Remaining clean and sober over the Holiday: Second half of group was spent in a discussion on the importance of remaining vigilant towards one's recovery during the upcoming hoiday week. Members shared their plans for Christmas and identified the ways in which they would remain alcohol and drug-free. They included plans in case something changed or became too challenging or tempting. Each member seemed very confident that he or she would have lots of support and remain abstinent.   Summary: The patient reported she is feeling better, but still had a lingering cough. She denied having attended any meetings over the weekend, but reported she had gone to "[employer]" and shot pool. She reminded the group that she doesn't have a problem with alcohol. I questioned this statement and reminded everyone that she had drank excessively and smoked pot all last weekend and she was sick for most of the week because of that destructive weekend. I wondered how she could  have gone to a bar and spent a couple of hours, but didn't consider attending any support groups for her addiction? She had nothing to offer. The patient reported she had decided she would not be going into treatment and that she had completed a positive interview and would probably be starting a new job after the first of the year. She reported she hoped to keep her apartment despite admitting that she felt guilty when she recognized that she has gotten almost all of her money from her parents and spent it on weed when they were going through serious financial struggles themselves. The patient is 21 yo, but displays the emotional maturity of a 21 yo at best. Her sobriety date remains 12/16. She will be asked to say good bye to the group on Friday and be discharged. She has been recommended to a higher level of care because of her continued relapses, but does not intend to seek entry into a higher level of care. Despite providing no answers to questions about how she intended this treatment to be different, the patient displays poor motivation to embrace a totally abstinent lifestyle.    Family Program: Family present? No   Name of family member(s):   UDS collected: No Results:   AA/NA attended?: No  Sponsor?: No   Shamiya Demeritt, LCAS

## 2012-08-19 ENCOUNTER — Other Ambulatory Visit (HOSPITAL_COMMUNITY): Payer: BC Managed Care – PPO

## 2012-08-22 ENCOUNTER — Other Ambulatory Visit (HOSPITAL_COMMUNITY): Payer: BC Managed Care – PPO | Admitting: Psychology

## 2012-08-22 DIAGNOSIS — F1220 Cannabis dependence, uncomplicated: Secondary | ICD-10-CM

## 2012-08-25 ENCOUNTER — Encounter (HOSPITAL_COMMUNITY): Payer: Self-pay | Admitting: Psychology

## 2012-08-25 NOTE — Progress Notes (Signed)
    Daily Group Progress Note  Program: CD-IOP   Group Time: 1-2:30 pm  Participation Level: Active  Behavioral Response: Appropriate and Sharing  Type of Therapy: Process Group  Topic:Group Process: first part of group was spent in process. Members shared about current struggles and issues in early recovery. Members discussed the past weekend. While some had attended 12-step meetings, other members had not gone to any. I questioned their lack of commitment and wondered why they couldn't spend 1 hour per day addressing their recovery? I pointed out they had spent many hours per day getting high.    Group Time: 2:45- 4pm  Participation Level: Active  Behavioral Response: Sharing, Rationalizing and Minimizing  Type of Therapy: Psycho-education Group  Topic: Identifying things you Value and Inner Strengths: second half of group was spent in a psych education session. A handout was provided asking members to identify "Things I Value" now in recovery along with having them "Inner Strengths". There was some confusion about values now versus while in their active addiction, because clearly they did not care about most things other than their drugs, alcohol and getting high. Many members had a difficult time identifying inner strengths and asked me to explain that one. There was a good discussion with members identifying life, health, family, integrity, and friends as just some of their values. The session was lively with good disclosure and members opened up and shared more about their inner selves than in the past. At the conclusion of the session, members shared their plans for the upcoming New Year's Celebration and how the planned on spending their evening.    Summary: The patient reported she had remained drug-free, but had a tough time over the weekend. She had a good talk with her mother and her mother shared about her earlier years and her experimentation with drugs. The patient reported  she is discouraged when she thinks about the last 3 years and how she has wasted that time high and not making any progress in her life. I reminded her that she would be saying good-bye to the group on Friday. Some of the group questioned why she had to leave and asked me to let her stay. I explained that she has been referred to residential treatment, but she states she doesn't want to go. One group member asked her about this and noted that it had been very helpful to him to go to rehab. Two other members both agreed that the inpatient had made it possible for them to stop using while another member said he could have never achieved sobriety without it. Almost to a member, this young woman was encouraged to go into treatment. She became teary and agreed that the group was really helpful and she didn't want to leave, but she noted she had considered [facility], but never heard back from them. She admitted she didn't think it would change anything in the 30 days she would be gone. The patient made many excuses for not entering a higher level of care, but her group members seemed to recognize her denial despite her excuses. She agreed that she would be here in group on Friday.    Family Program: Family present? No   Name of family member(s):   UDS collected: No Results:  AA/NA attended?: No  Sponsor?: No   Samie Reasons, LCAS

## 2012-08-26 ENCOUNTER — Other Ambulatory Visit (HOSPITAL_COMMUNITY): Payer: BC Managed Care – PPO | Attending: Psychiatry | Admitting: Psychology

## 2012-08-26 DIAGNOSIS — F1220 Cannabis dependence, uncomplicated: Secondary | ICD-10-CM

## 2012-08-29 ENCOUNTER — Other Ambulatory Visit (HOSPITAL_COMMUNITY): Payer: BC Managed Care – PPO

## 2012-08-30 ENCOUNTER — Encounter (HOSPITAL_COMMUNITY): Payer: Self-pay | Admitting: Psychology

## 2012-08-30 NOTE — Progress Notes (Unsigned)
    Daily Group Progress Note  Program: CD-IOP   Group Time: 1-2:30 pm  Participation Level: Active  Behavioral Response: Appropriate and Sharing  Type of Therapy: Psycho-education Group  Topic: The Relapse Process: Setting Yourself Up for Relapse. The first part of this group session was a psych educational piece on relapse and ways that people set themselves up to use. One member had checked-in with a new sobriety date and described his relapse on New Year's Eve. The group identified the many red flags that had been displayed prior to his actual use. Had this patient been paying better attention and sharing about this thoughts and feelings, he may have easily avoided the relapse. Instead, there was a failure to observe regular daily aspects of his recovery prior to using. This generated an excellent discussion about knowing one's vulnerabilities and how one must plan and protect one's self against them. There was good disclosure and sharing and the relapsing patient received good support and feedback.   Group Time: 2:45- 4pm  Participation Level: Active  Behavioral Response: Sharing  Type of Therapy: Process Group  Topic: Summary:    Family Program: Family present? No   Name of family member(s):   UDS collected: No Results:  AA/NA attended?: Yes, Thursday evening  Sponsor?: Yes   Raquell Richer, LCAS

## 2012-08-31 ENCOUNTER — Other Ambulatory Visit (HOSPITAL_COMMUNITY): Payer: BC Managed Care – PPO

## 2012-09-02 ENCOUNTER — Other Ambulatory Visit (HOSPITAL_COMMUNITY): Payer: BC Managed Care – PPO

## 2012-09-05 ENCOUNTER — Other Ambulatory Visit (HOSPITAL_COMMUNITY): Payer: BC Managed Care – PPO

## 2012-09-07 ENCOUNTER — Other Ambulatory Visit (HOSPITAL_COMMUNITY): Payer: BC Managed Care – PPO

## 2012-09-09 ENCOUNTER — Other Ambulatory Visit (HOSPITAL_COMMUNITY): Payer: BC Managed Care – PPO

## 2012-09-12 ENCOUNTER — Other Ambulatory Visit (HOSPITAL_COMMUNITY): Payer: BC Managed Care – PPO

## 2012-09-14 ENCOUNTER — Other Ambulatory Visit (HOSPITAL_COMMUNITY): Payer: BC Managed Care – PPO

## 2012-09-16 ENCOUNTER — Other Ambulatory Visit (HOSPITAL_COMMUNITY): Payer: BC Managed Care – PPO

## 2012-09-19 ENCOUNTER — Other Ambulatory Visit (HOSPITAL_COMMUNITY): Payer: BC Managed Care – PPO

## 2012-09-21 ENCOUNTER — Other Ambulatory Visit (HOSPITAL_COMMUNITY): Payer: BC Managed Care – PPO

## 2012-11-22 DIAGNOSIS — A749 Chlamydial infection, unspecified: Secondary | ICD-10-CM

## 2012-11-22 HISTORY — DX: Chlamydial infection, unspecified: A74.9

## 2012-11-23 ENCOUNTER — Encounter: Payer: Self-pay | Admitting: Gynecology

## 2012-11-23 ENCOUNTER — Ambulatory Visit (INDEPENDENT_AMBULATORY_CARE_PROVIDER_SITE_OTHER): Payer: BC Managed Care – PPO | Admitting: Gynecology

## 2012-11-23 ENCOUNTER — Other Ambulatory Visit (HOSPITAL_COMMUNITY)
Admission: RE | Admit: 2012-11-23 | Discharge: 2012-11-23 | Disposition: A | Discharge status: Home / Self Care | Payer: BC Managed Care – PPO | Source: Ambulatory Visit | Attending: Gynecology | Admitting: Gynecology

## 2012-11-23 VITALS — BP 110/70 | Ht 60.0 in | Wt 91.0 lb

## 2012-11-23 DIAGNOSIS — A499 Bacterial infection, unspecified: Secondary | ICD-10-CM

## 2012-11-23 DIAGNOSIS — Z01419 Encounter for gynecological examination (general) (routine) without abnormal findings: Secondary | ICD-10-CM

## 2012-11-23 DIAGNOSIS — Z3009 Encounter for other general counseling and advice on contraception: Secondary | ICD-10-CM

## 2012-11-23 DIAGNOSIS — B9689 Other specified bacterial agents as the cause of diseases classified elsewhere: Secondary | ICD-10-CM

## 2012-11-23 DIAGNOSIS — N930 Postcoital and contact bleeding: Secondary | ICD-10-CM

## 2012-11-23 DIAGNOSIS — N760 Acute vaginitis: Secondary | ICD-10-CM

## 2012-11-23 DIAGNOSIS — Z113 Encounter for screening for infections with a predominantly sexual mode of transmission: Secondary | ICD-10-CM

## 2012-11-23 DIAGNOSIS — N898 Other specified noninflammatory disorders of vagina: Secondary | ICD-10-CM

## 2012-11-23 LAB — CBC WITH DIFFERENTIAL/PLATELET
Basophils Absolute: 0 10*3/uL (ref 0.0–0.1)
Basophils Relative: 0 % (ref 0–1)
Eosinophils Absolute: 0 10*3/uL (ref 0.0–0.7)
Eosinophils Relative: 1 % (ref 0–5)
MCH: 29.7 pg (ref 26.0–34.0)
MCHC: 33.6 g/dL (ref 30.0–36.0)
MCV: 88.3 fL (ref 78.0–100.0)
Neutrophils Relative %: 70 % (ref 43–77)
Platelets: 300 10*3/uL (ref 150–400)
RBC: 4.45 MIL/uL (ref 3.87–5.11)
RDW: 13.8 % (ref 11.5–15.5)

## 2012-11-23 LAB — WET PREP FOR TRICH, YEAST, CLUE: Yeast Wet Prep HPF POC: NONE SEEN

## 2012-11-23 MED ORDER — METRONIDAZOLE 500 MG PO TABS: 500.0000 mg | ORAL_TABLET | Freq: Two times a day (BID) | ORAL | Status: DC

## 2012-11-23 NOTE — Progress Notes (Signed)
Laurie Chaney 1991-07-27 [id number]        22 y.o.  G0P0 for annual exam.  Several issues below.  Past medical history,surgical history, medications, allergies, family history and social history were all reviewed and documented in the EPIC chart. ROS:  Was performed and pertinent positives and negatives are included in the history.  Exam: Kim assistant Filed Vitals:   11/23/12 1519  BP: 110/70  Height: 5' (1.524 m)  Weight: 91 lb (41.277 kg)   General appearance  Normal Skin grossly normal Head/Neck normal with no cervical or supraclavicular adenopathy thyroid normal Lungs  clear Cardiac RR, without RMG Abdominal  soft, nontender, without masses, organomegaly or hernia Breasts  examined lying and sitting without masses, retractions, discharge or axillary adenopathy. Pelvic  Ext/BUS/vagina  normal with slight mucousy discharge  Cervix  normal without active bleeding. GC/Chlamydia, Pap done  Uterus  anteverted, normal size, shape and contour, midline and mobile nontender   Adnexa  Without masses or tenderness    Anus and perineum  normal     Assessment/Plan:  22 y.o. G0P0 female for annual exam.   1. Post coital bleeding/pain. Patient had intercourse where her partner went to deep and afterwards she had a lot of discomfort cramping and some vaginal bleeding. Only happened once. Has had some cramping on and off since then. Exam is normal. GC chlamydia done. Wet prep does show bacterial vaginosis. We'll treat with Flagyl 500 mg twice a day x7 days. Options to proceed with ultrasound rule out ovarian cystic process versus observation at present reviewed. Patient's comfortable with observation. If discomfort continues she'll represent for ultrasound. 2. STD screening. GC chlamydia done. 3. Contraceptive counseling. Not using anything for contraception at this time. Reviewed options to include pill patch ring Depo-Provera Implanon IUD. She did have attempted Mirena IUD placement in the  past and it would not stay in the cavity. I reviewed the new or IUD Skyla and that this may be a more appropriate choice if she would choose IUD. She does not want oral contraceptives because she forgets to take them. Does not like the idea of patches. Not sure she likes the idea of NuvaRing. Contemplating possible Depo-Provera or Nexplanon. I reviewed the insertional process with Nexplanon and the risks to include prolonged bleeding, neurovascular injury with permanent sequela, infection, bleeding/hematoma, migration with difficulty to remove and the need to remove it at 3 years. Patient wants to think of her options she will followup during her next menses if she chooses Depo-Provera, Nexplanon or IUD. The need to continue with condoms regardless to help decrease risk of STDs with stress. 4. Pap smear 2012. She does relate having an abnormal Pap smear sometime in the last year or 2 at the [facility] where they did colposcopy but no biopsies or followup treatment. Pap done today. We'll continue to follow assuming negative. 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 Eda Paschal. 5. Breast health. SBE monthly reviewed. 6. Gardasil series received 2007x3 7. Health maintenance. Baseline CBC ordered. We'll hold on other blood work as low risk historically.    Dara Lords MD, 3:56 PM 11/23/2012  And

## 2012-11-23 NOTE — Patient Instructions (Signed)
Followup with contraceptive decision. Recommend placement during your menses for either Skyla IUD, Depo-Provera or Nexplanon (rod in arm). Continue to use condoms regardless of what form of contraception you choose to help decrease the risk of STDs. Followup in one year for annual exam.

## 2012-11-25 ENCOUNTER — Telehealth: Payer: Self-pay | Admitting: Gynecology

## 2012-11-25 ENCOUNTER — Encounter: Payer: Self-pay | Admitting: Gynecology

## 2012-11-25 LAB — GC/CHLAMYDIA PROBE AMP: CT Probe RNA: POSITIVE — AB

## 2012-11-25 MED ORDER — DOXYCYCLINE HYCLATE 100 MG PO TABS: 100.0000 mg | ORAL_TABLET | Freq: Two times a day (BID) | ORAL | Status: DC

## 2012-11-25 NOTE — Telephone Encounter (Signed)
Left message for pt to call.

## 2012-11-25 NOTE — Telephone Encounter (Signed)
Pt informed with the below note. 

## 2012-11-25 NOTE — Telephone Encounter (Signed)
Tell patient that her cervical cultures did show Chlamydia. She needs to take doxycycline 100 mg twice daily for 7 days. This may account for her bleeding after intercourse and some of the discomfort. She needs to followup in one to 2 months after taking the antibiotics so we can reculture her for test of cure. She needs to abstain from intercourse with her boyfriend until he is seen and treated and he needs to see his doctor to do so.

## 2012-11-25 NOTE — Telephone Encounter (Signed)
She will call back to schedule test of cure.

## 2012-11-29 ENCOUNTER — Telehealth: Payer: Self-pay | Admitting: Gynecology

## 2012-11-29 NOTE — Telephone Encounter (Signed)
I LM for pt on her cell that her [facility] insurance covers the Nexplanon and insertion at 100%, no copay. She will call to confirm she wants to proceed and has been told this needs to be inserted while she is on her cycle/WL

## 2012-12-01 ENCOUNTER — Other Ambulatory Visit: Payer: Self-pay | Admitting: Gynecology

## 2012-12-01 DIAGNOSIS — Z3049 Encounter for surveillance of other contraceptives: Secondary | ICD-10-CM

## 2012-12-20 ENCOUNTER — Ambulatory Visit: Payer: BC Managed Care – PPO | Admitting: Gynecology

## 2012-12-29 ENCOUNTER — Ambulatory Visit: Payer: BC Managed Care – PPO | Admitting: Gynecology

## 2013-01-03 ENCOUNTER — Ambulatory Visit (INDEPENDENT_AMBULATORY_CARE_PROVIDER_SITE_OTHER): Payer: BC Managed Care – PPO | Admitting: Gynecology

## 2013-01-03 ENCOUNTER — Encounter: Payer: Self-pay | Admitting: Gynecology

## 2013-01-03 DIAGNOSIS — A749 Chlamydial infection, unspecified: Secondary | ICD-10-CM

## 2013-01-03 DIAGNOSIS — N898 Other specified noninflammatory disorders of vagina: Secondary | ICD-10-CM

## 2013-01-03 LAB — WET PREP FOR TRICH, YEAST, CLUE: Yeast Wet Prep HPF POC: NONE SEEN

## 2013-01-03 MED ORDER — METRONIDAZOLE 500 MG PO TABS: 500.0000 mg | ORAL_TABLET | Freq: Two times a day (BID) | ORAL | Status: DC

## 2013-01-03 NOTE — Progress Notes (Signed)
Patient presents for test of cure. A chlamydia and was treated with doxycycline 100 mg twice a day x7 days. Is planning on Nexplanon and will followup with her next menses for placement.  Exam with Selena Batten assistant External BUS vagina with yellow discharge. Cervix normal GC/Chlamydia done. Uterus normal size midline mobile nontender. Adnexa without masses or tenderness.  Assessment and plan: Test of cure for Chlamydia done. Yellow discharge with wet prep consistent with bacterial vaginosis. Flagyl 500 mg twice a day x7 days, alcohol avoidance discussed and accepted. Followup for nexplanon as scheduled.

## 2013-01-03 NOTE — Patient Instructions (Signed)
Take Flagyl antibiotic twice daily for 7 days. Avoid alcohol while taking. Followup for birth control implant as scheduled.

## 2013-01-20 ENCOUNTER — Ambulatory Visit: Payer: BC Managed Care – PPO | Admitting: Gynecology

## 2013-03-04 ENCOUNTER — Other Ambulatory Visit (HOSPITAL_COMMUNITY)
Admission: RE | Admit: 2013-03-04 | Discharge: 2013-03-04 | Disposition: A | Discharge status: Home / Self Care | Payer: BC Managed Care – PPO | Source: Ambulatory Visit | Attending: Emergency Medicine | Admitting: Emergency Medicine

## 2013-03-04 ENCOUNTER — Emergency Department (HOSPITAL_COMMUNITY)
Admission: EM | Admit: 2013-03-04 | Discharge: 2013-03-04 | Disposition: A | Discharge status: Home / Self Care | Payer: BC Managed Care – PPO | Source: Home / Self Care | Attending: Emergency Medicine | Admitting: Emergency Medicine

## 2013-03-04 ENCOUNTER — Encounter (HOSPITAL_COMMUNITY): Payer: Self-pay | Admitting: *Deleted

## 2013-03-04 DIAGNOSIS — Z113 Encounter for screening for infections with a predominantly sexual mode of transmission: Secondary | ICD-10-CM | POA: Insufficient documentation

## 2013-03-04 DIAGNOSIS — N390 Urinary tract infection, site not specified: Secondary | ICD-10-CM

## 2013-03-04 DIAGNOSIS — N72 Inflammatory disease of cervix uteri: Secondary | ICD-10-CM

## 2013-03-04 DIAGNOSIS — N760 Acute vaginitis: Secondary | ICD-10-CM | POA: Insufficient documentation

## 2013-03-04 LAB — POCT URINALYSIS DIP (DEVICE)
Bilirubin Urine: NEGATIVE
Nitrite: NEGATIVE
pH: 7 (ref 5.0–8.0)

## 2013-03-04 MED ORDER — METRONIDAZOLE 500 MG PO TABS: 500.0000 mg | ORAL_TABLET | Freq: Two times a day (BID) | ORAL | Status: DC

## 2013-03-04 MED ORDER — CEPHALEXIN 250 MG PO CAPS: 250.0000 mg | ORAL_CAPSULE | Freq: Four times a day (QID) | ORAL | Status: DC

## 2013-03-04 NOTE — ED Notes (Signed)
Woke up during night with constant pain across low abd, wrapping around to lower back; c/o dysuria, frequent urination, and urinary urgency.  Unaware if fevers at home; temp 99.9 at [facility].  + nausea, no vomiting.

## 2013-03-04 NOTE — ED Provider Notes (Signed)
History    CSN: [id number] Arrival date & time 03/04/13  1409  First MD Initiated Contact with Patient 03/04/13 1503     Chief Complaint  Patient presents with  . Abdominal Pain  . Urinary Tract Infection   (Consider location/radiation/quality/duration/timing/severity/associated sxs/prior Treatment) HPI Comments: 23 year old female presents with pain across the lower pelvis associated with dysuria and urinary frequency. Her last period was 5 days ago with a 2-1/2 day flow. Has a history of STDs. She denies vaginal discharge.  Past Medical History  Diagnosis Date  . Acne   . Migraine   . Anxiety   . Seizures     had 6 none since 4th grade  . Depression     hx of wellbutrin and lexapro use  . History of recurrent UTIs     as a child had evaluation  . History of varicella   . Overdose 07/23/2011  . Respiratory failure, acute 07/23/2011  . Gonorrhea   . Chlamydia 11/2012   Past Surgical History  Procedure Laterality Date  . Tympanostomy tube placement      age 71-4  . Colposcopy    . Gynecologic cryosurgery      for cervicitis not dysplasia   Family History  Problem Relation Age of Onset  . Hypertension Mother   . Depression Mother   . Anxiety disorder Mother   . Drug abuse Mother   . Diabetes Paternal Grandmother   . Ovarian cancer Maternal Grandmother    History  Substance Use Topics  . Smoking status: Former Games developer  . Smokeless tobacco: Not on file  . Alcohol Use: Yes     Comment: socially   OB History   Grav Para Term Preterm Abortions TAB SAB Ect Mult Living   0              Review of Systems  Constitutional: Negative.   Respiratory: Negative.   Cardiovascular: Negative.   Gastrointestinal: Negative.   Genitourinary: Positive for dysuria, frequency, hematuria, vaginal bleeding, vaginal discharge and pelvic pain.  Neurological: Negative.     Allergies  Review of patient's allergies indicates no known allergies.  Home Medications   Current  Outpatient Rx  Name  Route  Sig  Dispense  Refill  . FLUoxetine HCl (PROZAC PO)   Oral   Take by mouth.         Marland Kitchen amphetamine-dextroamphetamine (ADDERALL) 30 MG tablet   Oral   Take 30 mg by mouth 2 (two) times daily.         . cephALEXin (KEFLEX) 250 MG capsule   Oral   Take 1 capsule (250 mg total) by mouth 4 (four) times daily.   28 capsule   0   . clonazePAM (KLONOPIN) 1 MG tablet   Oral   Take 1 mg by mouth 3 (three) times daily as needed for anxiety.         . metroNIDAZOLE (FLAGYL) 500 MG tablet   Oral   Take 1 tablet (500 mg total) by mouth 2 (two) times daily. For 7 days.  Avoid alcohol while taking   14 tablet   0   . metroNIDAZOLE (FLAGYL) 500 MG tablet   Oral   Take 1 tablet (500 mg total) by mouth 2 (two) times daily. X 7 days   14 tablet   0    BP 120/75  Pulse 76  Temp(Src) 99.9 F (37.7 C) (Oral)  Resp 18  SpO2 99%  LMP 02/27/2013 Physical Exam  Nursing note and vitals reviewed. Constitutional: She is oriented to person, place, and time. She appears well-developed and well-nourished. No distress.  Neck: Normal range of motion. Neck supple.  Cardiovascular: Normal rate, regular rhythm and normal heart sounds.   Pulmonary/Chest: Effort normal and breath sounds normal.  Abdominal: Soft. She exhibits no distension. There is no tenderness. There is no rebound and no guarding.  Genitourinary:  Palpation of the external pelvis reveals tenderness over the right, left and mid pelvis. No palpable masses. Normal external female genitalia. No appreciable vaginal discharge however there is scant residual bleeding for her recent menses. Cervix is left of midline. Ectocervix is pink and without lesions or erythema.  Musculoskeletal: She exhibits no edema and no tenderness.  Neurological: She is alert and oriented to person, place, and time.  Skin: Skin is warm and dry.  Psychiatric: She has a normal mood and affect.    ED Course  Procedures (including  critical care time) Labs Reviewed  POCT URINALYSIS DIP (DEVICE) - Abnormal; Notable for the following:    Hgb urine dipstick MODERATE (*)    Protein, ur 100 (*)    Leukocytes, UA MODERATE (*)    All other components within normal limits  URINE CULTURE  POCT PREGNANCY, URINE  CERVICOVAGINAL ANCILLARY ONLY   No results found. 1. UTI (lower urinary tract infection)   2. Cervicitis     MDM  Flagyl 500 bid Keflex 250 qid Affirm swabs Drink plenty of fluids. Urine culture  Hayden Rasmussen, NP 03/04/13 1606

## 2013-03-04 NOTE — ED Provider Notes (Signed)
Medical screening examination/treatment/procedure(s) were performed by non-physician practitioner and as supervising physician I was immediately available for consultation/collaboration.  Raynald Blend, MD 03/04/13 1622

## 2013-03-06 LAB — URINE CULTURE
Colony Count: 50000
Special Requests: NORMAL

## 2013-03-06 NOTE — ED Notes (Signed)
GC neg., Chlamydia pos., Affirm: Candida, Gardnerella and Trich neg., Urine culture: 50,000 colonies E. Coli. Pt. adequately treated for UTI with Keflex. Message to Dr. Ladon Applebaum for further orders. Laurie Chaney 03/06/2013

## 2013-03-07 ENCOUNTER — Emergency Department (HOSPITAL_COMMUNITY)
Admission: EM | Admit: 2013-03-07 | Discharge: 2013-03-08 | Disposition: A | Discharge status: Other Acute Inpatient Hospital | Payer: BC Managed Care – PPO | Attending: Emergency Medicine | Admitting: Emergency Medicine

## 2013-03-07 ENCOUNTER — Telehealth (HOSPITAL_COMMUNITY): Payer: Self-pay | Admitting: *Deleted

## 2013-03-07 ENCOUNTER — Encounter (HOSPITAL_COMMUNITY): Payer: Self-pay

## 2013-03-07 DIAGNOSIS — Z3202 Encounter for pregnancy test, result negative: Secondary | ICD-10-CM | POA: Insufficient documentation

## 2013-03-07 DIAGNOSIS — F39 Unspecified mood [affective] disorder: Secondary | ICD-10-CM | POA: Insufficient documentation

## 2013-03-07 DIAGNOSIS — F32A Depression, unspecified: Secondary | ICD-10-CM

## 2013-03-07 DIAGNOSIS — F329 Major depressive disorder, single episode, unspecified: Secondary | ICD-10-CM

## 2013-03-07 DIAGNOSIS — Z8709 Personal history of other diseases of the respiratory system: Secondary | ICD-10-CM | POA: Insufficient documentation

## 2013-03-07 DIAGNOSIS — F3289 Other specified depressive episodes: Secondary | ICD-10-CM | POA: Insufficient documentation

## 2013-03-07 DIAGNOSIS — Z87891 Personal history of nicotine dependence: Secondary | ICD-10-CM | POA: Insufficient documentation

## 2013-03-07 DIAGNOSIS — Z79899 Other long term (current) drug therapy: Secondary | ICD-10-CM | POA: Insufficient documentation

## 2013-03-07 DIAGNOSIS — R45851 Suicidal ideations: Secondary | ICD-10-CM

## 2013-03-07 DIAGNOSIS — Z872 Personal history of diseases of the skin and subcutaneous tissue: Secondary | ICD-10-CM | POA: Insufficient documentation

## 2013-03-07 DIAGNOSIS — Z8619 Personal history of other infectious and parasitic diseases: Secondary | ICD-10-CM | POA: Insufficient documentation

## 2013-03-07 DIAGNOSIS — F1910 Other psychoactive substance abuse, uncomplicated: Secondary | ICD-10-CM

## 2013-03-07 DIAGNOSIS — Z87440 Personal history of urinary (tract) infections: Secondary | ICD-10-CM | POA: Insufficient documentation

## 2013-03-07 DIAGNOSIS — F1220 Cannabis dependence, uncomplicated: Secondary | ICD-10-CM

## 2013-03-07 DIAGNOSIS — F3181 Bipolar II disorder: Secondary | ICD-10-CM

## 2013-03-07 DIAGNOSIS — G43909 Migraine, unspecified, not intractable, without status migrainosus: Secondary | ICD-10-CM | POA: Insufficient documentation

## 2013-03-07 DIAGNOSIS — F411 Generalized anxiety disorder: Secondary | ICD-10-CM | POA: Insufficient documentation

## 2013-03-07 DIAGNOSIS — G40909 Epilepsy, unspecified, not intractable, without status epilepticus: Secondary | ICD-10-CM | POA: Insufficient documentation

## 2013-03-07 LAB — CBC WITH DIFFERENTIAL/PLATELET
Basophils Absolute: 0 10*3/uL (ref 0.0–0.1)
Basophils Relative: 1 % (ref 0–1)
Eosinophils Relative: 4 % (ref 0–5)
HCT: 39.2 % (ref 36.0–46.0)
Lymphocytes Relative: 33 % (ref 12–46)
MCHC: 32.7 g/dL (ref 30.0–36.0)
MCV: 88.7 fL (ref 78.0–100.0)
Monocytes Absolute: 0.5 10*3/uL (ref 0.1–1.0)
RDW: 12.9 % (ref 11.5–15.5)

## 2013-03-07 LAB — URINALYSIS W MICROSCOPIC + REFLEX CULTURE
Glucose, UA: NEGATIVE mg/dL
Nitrite: NEGATIVE
Protein, ur: NEGATIVE mg/dL
Urobilinogen, UA: 0.2 mg/dL (ref 0.0–1.0)

## 2013-03-07 LAB — ETHANOL: Alcohol, Ethyl (B): <11 mg/dL (ref 0–11)

## 2013-03-07 LAB — BASIC METABOLIC PANEL
CO2: 28 mEq/L (ref 19–32)
Calcium: 9.6 mg/dL (ref 8.4–10.5)
Creatinine, Ser: 0.8 mg/dL (ref 0.50–1.10)

## 2013-03-07 LAB — RAPID URINE DRUG SCREEN, HOSP PERFORMED: Amphetamines: NOT DETECTED

## 2013-03-07 MED ORDER — ACETAMINOPHEN 325 MG PO TABS: 650.0000 mg | ORAL_TABLET | ORAL | Status: DC | PRN

## 2013-03-07 MED ORDER — CLONAZEPAM 1 MG PO TABS
1.0000 mg | ORAL_TABLET | Freq: Three times a day (TID) | ORAL | Status: DC | PRN
  Administered 2013-03-07 – 2013-03-08 (×2): 1 mg via ORAL
  Filled 2013-03-07 (×2): qty 1

## 2013-03-07 MED ORDER — OLANZAPINE-FLUOXETINE HCL 3-25 MG PO CAPS
1.0000 | ORAL_CAPSULE | Freq: Every evening | ORAL | Status: DC
  Filled 2013-03-07: qty 1

## 2013-03-07 MED ORDER — CEPHALEXIN 250 MG PO CAPS
250.0000 mg | ORAL_CAPSULE | Freq: Four times a day (QID) | ORAL | Status: DC
  Administered 2013-03-07 – 2013-03-08 (×4): 250 mg via ORAL
  Filled 2013-03-07 (×7): qty 1

## 2013-03-07 MED ORDER — LAMOTRIGINE 100 MG PO TABS
100.0000 mg | ORAL_TABLET | Freq: Every morning | ORAL | Status: DC
  Administered 2013-03-08: 100 mg via ORAL
  Filled 2013-03-07: qty 1

## 2013-03-07 MED ORDER — IBUPROFEN 200 MG PO TABS: 600.0000 mg | ORAL_TABLET | Freq: Three times a day (TID) | ORAL | Status: DC | PRN

## 2013-03-07 MED ORDER — ONDANSETRON HCL 4 MG PO TABS: 4.0000 mg | ORAL_TABLET | Freq: Three times a day (TID) | ORAL | Status: DC | PRN

## 2013-03-07 NOTE — ED Notes (Signed)
Per EMS, SI for at least once a week since she was 22 years old-wants to wrap car around a tree-parents are taking out papers on her

## 2013-03-07 NOTE — ED Notes (Signed)
Pt brought here by EMS IVC by her parents, pt c/o SI with no plan; pt tearful

## 2013-03-07 NOTE — ED Provider Notes (Signed)
History  This chart was scribed for Jaynie Crumble, PA-C working with Juliet Rude. Rubin Payor, MD by Greggory Stallion, ED scribe. This patient was seen in room [facility] and the patient's care was started at 4:13 PM.  CSN: [id number] Arrival date & time 03/07/13  1547   Chief Complaint  Patient presents with  . Medical Clearance   The history is provided by the patient. No language interpreter was used.    HPI Comments: Laurie Chaney is a 22 y.o. female who presents to the Emergency Department complaining of intermittent suicidal ideations that started about a week ago. Pt states she has had SI on and off since she was 10. She states nothing really brought it on but it won't get better. Pt states her plan is to wrap her car around a tree. Pt states she recently moved to [place] to get away from [place]. Pt states she see a psychiatrist and has tried many different medications. She states they help for about a week then don't help anymore. Pt states she takes her medications everyday. Pt states she has not seen a therapist in a few years and doesn't know if one would help now. Pt states she occasionally smokes marijuana but hasn't since she moved to [place]. Pt denies any other recreational drug use or alcohol use.   Past Medical History  Diagnosis Date  . Acne   . Migraine   . Anxiety   . Seizures     had 6 none since 4th grade  . Depression     hx of wellbutrin and lexapro use  . History of recurrent UTIs     as a child had evaluation  . History of varicella   . Overdose 07/23/2011  . Respiratory failure, acute 07/23/2011  . Gonorrhea   . Chlamydia 11/2012   Past Surgical History  Procedure Laterality Date  . Tympanostomy tube placement      age 2-4  . Colposcopy    . Gynecologic cryosurgery      for cervicitis not dysplasia   Family History  Problem Relation Age of Onset  . Hypertension Mother   . Depression Mother   . Anxiety disorder Mother   . Drug  abuse Mother   . Diabetes Paternal Grandmother   . Ovarian cancer Maternal Grandmother    History  Substance Use Topics  . Smoking status: Former Games developer  . Smokeless tobacco: Not on file  . Alcohol Use: Yes     Comment: socially   OB History   Grav Para Term Preterm Abortions TAB SAB Ect Mult Living   0              Review of Systems  Psychiatric/Behavioral: Positive for suicidal ideas and dysphoric mood. The patient is nervous/anxious.   All other systems reviewed and are negative.    Allergies  Review of patient's allergies indicates no known allergies.  Home Medications   Current Outpatient Rx  Name  Route  Sig  Dispense  Refill  . cephALEXin (KEFLEX) 250 MG capsule   Oral   Take 1 capsule (250 mg total) by mouth 4 (four) times daily.   28 capsule   0   . clonazePAM (KLONOPIN) 1 MG tablet   Oral   Take 1 mg by mouth 3 (three) times daily as needed for anxiety.         . lamoTRIgine (LAMICTAL) 25 MG tablet   Oral   Take 100 mg by mouth every morning.         Marland Kitchen  metroNIDAZOLE (FLAGYL) 500 MG tablet   Oral   Take 1 tablet (500 mg total) by mouth 2 (two) times daily. X 7 days   14 tablet   0   . OLANZapine-FLUoxetine (SYMBYAX) 3-25 MG per capsule   Oral   Take 1 capsule by mouth every evening.          LMP 02/27/2013  Physical Exam  Nursing note and vitals reviewed. Constitutional: She is oriented to person, place, and time. She appears well-developed and well-nourished. No distress.  HENT:  Head: Normocephalic and atraumatic.  Eyes: EOM are normal.  Neck: Neck supple. No tracheal deviation present.  Cardiovascular: Normal rate, regular rhythm and normal heart sounds.   No murmur heard. Pulmonary/Chest: Effort normal. No respiratory distress. She has no wheezes. She has no rales.  Musculoskeletal: Normal range of motion.  Neurological: She is alert and oriented to person, place, and time.  Skin: Skin is warm and dry.  Psychiatric:  Tearful,  anxious, depressed.     ED Course  Procedures (including critical care time)   COORDINATION OF CARE: 4:20 PM-Discussed treatment plan which includes talking with psychiatrists with pt at bedside and pt agreed to plan.   Results for orders placed during the hospital encounter of 03/07/13  CBC WITH DIFFERENTIAL      Result Value Range   WBC 4.9  4.0 - 10.5 K/uL   RBC 4.42  3.87 - 5.11 MIL/uL   Hemoglobin 12.8  12.0 - 15.0 g/dL   HCT 84.6  96.2 - 95.2 %   MCV 88.7  78.0 - 100.0 fL   MCH 29.0  26.0 - 34.0 pg   MCHC 32.7  30.0 - 36.0 g/dL   RDW 84.1  32.4 - 40.1 %   Platelets 348  150 - 400 K/uL   Neutrophils Relative % 53  43 - 77 %   Neutro Abs 2.6  1.7 - 7.7 K/uL   Lymphocytes Relative 33  12 - 46 %   Lymphs Abs 1.6  0.7 - 4.0 K/uL   Monocytes Relative 10  3 - 12 %   Monocytes Absolute 0.5  0.1 - 1.0 K/uL   Eosinophils Relative 4  0 - 5 %   Eosinophils Absolute 0.2  0.0 - 0.7 K/uL   Basophils Relative 1  0 - 1 %   Basophils Absolute 0.0  0.0 - 0.1 K/uL  BASIC METABOLIC PANEL      Result Value Range   Sodium 139  135 - 145 mEq/L   Potassium 4.2  3.5 - 5.1 mEq/L   Chloride 102  96 - 112 mEq/L   CO2 28  19 - 32 mEq/L   Glucose, Bld 91  70 - 99 mg/dL   BUN 8  6 - 23 mg/dL   Creatinine, Ser 0.27  0.50 - 1.10 mg/dL   Calcium 9.6  8.4 - 25.3 mg/dL   GFR calc non Af Amer >90  >90 mL/min   GFR calc Af Amer >90  >90 mL/min  ETHANOL      Result Value Range   Alcohol, Ethyl (B) <11  0 - 11 mg/dL  URINE RAPID DRUG SCREEN (HOSP PERFORMED)      Result Value Range   Opiates NONE DETECTED  NONE DETECTED   Cocaine NONE DETECTED  NONE DETECTED   Benzodiazepines POSITIVE (*) NONE DETECTED   Amphetamines NONE DETECTED  NONE DETECTED   Tetrahydrocannabinol POSITIVE (*) NONE DETECTED   Barbiturates NONE DETECTED  NONE DETECTED  URINALYSIS W MICROSCOPIC + REFLEX CULTURE      Result Value Range   Color, Urine AMBER (*) YELLOW   APPearance CLOUDY (*) CLEAR   Specific Gravity, Urine  1.021  1.005 - 1.030   pH 5.0  5.0 - 8.0   Glucose, UA NEGATIVE  NEGATIVE mg/dL   Hgb urine dipstick NEGATIVE  NEGATIVE   Bilirubin Urine NEGATIVE  NEGATIVE   Ketones, ur NEGATIVE  NEGATIVE mg/dL   Protein, ur NEGATIVE  NEGATIVE mg/dL   Urobilinogen, UA 0.2  0.0 - 1.0 mg/dL   Nitrite NEGATIVE  NEGATIVE   Leukocytes, UA MODERATE (*) NEGATIVE   WBC, UA 7-10  <3 WBC/hpf   RBC / HPF 0-2  <3 RBC/hpf   Bacteria, UA RARE  RARE   Squamous Epithelial / LPF RARE  RARE   Casts HYALINE CASTS (*) NEGATIVE  POCT PREGNANCY, URINE      Result Value Range   Preg Test, Ur NEGATIVE  NEGATIVE   No results found.   5:13 PM Discussed with ACT, will assess.    Labs Reviewed - No data to display No results found.  1. Bipolar 2 disorder, major depressive episode   2. Suicidal ideations   3. Depression   4. Cannabis dependence, unspecified   5. Suicidal ideation     MDM  Pt with depression, SI. Plan to drive a car into a tree. Pt with IVC papers taken out by her dad. Medically cleared. ACT contacted, will assess.    I personally performed the services described in this documentation, which was scribed in my presence. The recorded information has been reviewed and is accurate.   Lottie Mussel, PA-C 03/10/13 1558

## 2013-03-07 NOTE — ED Notes (Signed)
Order obtained for Zithromax 1 gm po x 1 dose from Hayden Rasmussen NP.  I called pt. and left a message to call. Vassie Moselle 03/07/2013

## 2013-03-07 NOTE — ED Notes (Signed)
Pt denies SI/HI/AVH.  Pt guarded and forwards little.  Pt depressed.  Pt states "I don't want to be here, but other than that I am fine."  Pt states that she did not want to speak with her father earlier.

## 2013-03-07 NOTE — Consult Note (Signed)
Reason for Consult:Eval for Ip psychaitric Mgmt Referring Physician: [facility] EDP  Laurie Chaney is an 22 y.o. female.  HPI: Pt is a 22 y/o WF, known to [facility] as a adolescent and adult patient, presenting with exacerbated depressive sx x several weeks duration, due to financial stressors and interpersonal challenges with family members.Pt has a hx of bipolar 2 and is medicated but not receiving psychotherapy at this time. Patient endorses SI with plan:" take her car and hit a tree" but denies HI or AVH at present.  Past Medical History  Diagnosis Date  . Acne   . Migraine   . Anxiety   . Seizures     had 6 none since 4th grade  . Depression     hx of wellbutrin and lexapro use  . History of recurrent UTIs     as a child had evaluation  . History of varicella   . Overdose 07/23/2011  . Respiratory failure, acute 07/23/2011  . Gonorrhea   . Chlamydia 11/2012    Past Surgical History  Procedure Laterality Date  . Tympanostomy tube placement      age 45-4  . Colposcopy    . Gynecologic cryosurgery      for cervicitis not dysplasia    Family History  Problem Relation Age of Onset  . Hypertension Mother   . Depression Mother   . Anxiety disorder Mother   . Drug abuse Mother   . Diabetes Paternal Grandmother   . Ovarian cancer Maternal Grandmother     Social History:  reports that she has quit smoking. She does not have any smokeless tobacco history on file. She reports that  drinks alcohol. She reports that she does not use illicit drugs.  Allergies: No Known Allergies  Medications: I have reviewed the patient's current medications.  Results for orders placed during the hospital encounter of 03/07/13 (from the past 48 hour(s))  CBC WITH DIFFERENTIAL     Status: None   Collection Time    03/07/13  4:13 PM      Result Value Range   WBC 4.9  4.0 - 10.5 K/uL   RBC 4.42  3.87 - 5.11 MIL/uL   Hemoglobin 12.8  12.0 - 15.0 g/dL   HCT 16.1  09.6 - 04.5 %   MCV 88.7  78.0 - 100.0  fL   MCH 29.0  26.0 - 34.0 pg   MCHC 32.7  30.0 - 36.0 g/dL   RDW 40.9  81.1 - 91.4 %   Platelets 348  150 - 400 K/uL   Neutrophils Relative % 53  43 - 77 %   Neutro Abs 2.6  1.7 - 7.7 K/uL   Lymphocytes Relative 33  12 - 46 %   Lymphs Abs 1.6  0.7 - 4.0 K/uL   Monocytes Relative 10  3 - 12 %   Monocytes Absolute 0.5  0.1 - 1.0 K/uL   Eosinophils Relative 4  0 - 5 %   Eosinophils Absolute 0.2  0.0 - 0.7 K/uL   Basophils Relative 1  0 - 1 %   Basophils Absolute 0.0  0.0 - 0.1 K/uL  BASIC METABOLIC PANEL     Status: None   Collection Time    03/07/13  4:13 PM      Result Value Range   Sodium 139  135 - 145 mEq/L   Potassium 4.2  3.5 - 5.1 mEq/L   Chloride 102  96 - 112 mEq/L   CO2 28  19 - 32 mEq/L   Glucose, Bld 91  70 - 99 mg/dL   BUN 8  6 - 23 mg/dL   Creatinine, Ser 1.61  0.50 - 1.10 mg/dL   Calcium 9.6  8.4 - 09.6 mg/dL   GFR calc non Af Amer >90  >90 mL/min   GFR calc Af Amer >90  >90 mL/min   Comment:            The eGFR has been calculated     using the CKD EPI equation.     This calculation has not been     validated in all clinical     situations.     eGFR's persistently     <90 mL/min signify     possible Chronic Kidney Disease.  ETHANOL     Status: None   Collection Time    03/07/13  4:13 PM      Result Value Range   Alcohol, Ethyl (B) <11  0 - 11 mg/dL   Comment:            LOWEST DETECTABLE LIMIT FOR     SERUM ALCOHOL IS 11 mg/dL     FOR MEDICAL PURPOSES ONLY  URINE RAPID DRUG SCREEN (HOSP PERFORMED)     Status: Abnormal   Collection Time    03/07/13  4:32 PM      Result Value Range   Opiates NONE DETECTED  NONE DETECTED   Cocaine NONE DETECTED  NONE DETECTED   Benzodiazepines POSITIVE (*) NONE DETECTED   Amphetamines NONE DETECTED  NONE DETECTED   Tetrahydrocannabinol POSITIVE (*) NONE DETECTED   Barbiturates NONE DETECTED  NONE DETECTED   Comment:            DRUG SCREEN FOR MEDICAL PURPOSES     ONLY.  IF CONFIRMATION IS NEEDED     FOR ANY  PURPOSE, NOTIFY LAB     WITHIN 5 DAYS.                LOWEST DETECTABLE LIMITS     FOR URINE DRUG SCREEN     Drug Class       Cutoff (ng/mL)     Amphetamine      1000     Barbiturate      200     Benzodiazepine   200     Tricyclics       300     Opiates          300     Cocaine          300     THC              50  URINALYSIS W MICROSCOPIC + REFLEX CULTURE     Status: Abnormal   Collection Time    03/07/13  4:32 PM      Result Value Range   Color, Urine AMBER (*) YELLOW   Comment: BIOCHEMICALS MAY BE AFFECTED BY COLOR   APPearance CLOUDY (*) CLEAR   Specific Gravity, Urine 1.021  1.005 - 1.030   pH 5.0  5.0 - 8.0   Glucose, UA NEGATIVE  NEGATIVE mg/dL   Hgb urine dipstick NEGATIVE  NEGATIVE   Bilirubin Urine NEGATIVE  NEGATIVE   Ketones, ur NEGATIVE  NEGATIVE mg/dL   Protein, ur NEGATIVE  NEGATIVE mg/dL   Urobilinogen, UA 0.2  0.0 - 1.0 mg/dL   Nitrite NEGATIVE  NEGATIVE   Leukocytes, UA MODERATE (*) NEGATIVE   WBC, UA  7-10  <3 WBC/hpf   RBC / HPF 0-2  <3 RBC/hpf   Bacteria, UA RARE  RARE   Squamous Epithelial / LPF RARE  RARE   Casts HYALINE CASTS (*) NEGATIVE  POCT PREGNANCY, URINE     Status: None   Collection Time    03/07/13  4:40 PM      Result Value Range   Preg Test, Ur NEGATIVE  NEGATIVE   Comment:            THE SENSITIVITY OF THIS     METHODOLOGY IS >24 mIU/mL    No results found.  Review of Systems  Psychiatric/Behavioral: Positive for depression, suicidal ideas and substance abuse. Negative for hallucinations and memory loss. The patient has insomnia. The patient is not nervous/anxious.        Patient with hx of bipolar 2, endorsing SI with plan, but denies HI, AVH.  All other systems reviewed and are negative.   Blood pressure 103/65, pulse 79, temperature 97.6 F (36.4 C), temperature source Oral, resp. rate 16, last menstrual period 02/27/2013, SpO2 99.00%. Physical Exam  Nursing note and vitals reviewed. Constitutional: She is oriented to  person, place, and time. She appears well-developed.  HENT:  Head: Normocephalic.  Eyes: Pupils are equal, round, and reactive to light.  Neck: Neck supple. No thyromegaly present.  Respiratory: Breath sounds normal.  GI: Bowel sounds are normal.  Neurological: She is alert and oriented to person, place, and time.  Skin: Skin is warm and dry.  Psychiatric:  Mild solum demeaner, with good eye contact, proper affect and good insight.     Assessment/Plan: 1) Admit to [facility] pending bed for crises mgmt, safety and stabilization of mood d/o 2) Intensive IP psychotherapy and  Pharmaceutical therapeutic interventions as applicable 3) mgmt of applicable co-morbid conditions 4) Social work to aid in OP support services and psychiatric care to decrease chance of relapse or frequent readmissions.  Laurie Chaney E 03/07/2013, 10:26 PM

## 2013-03-07 NOTE — ED Notes (Signed)
Pt signed the consent form for [place] to discuss her medical treatment with her father.  I informed pt that her father was on the phone and wanted to speak with her.  Pt states that she will call him back.  I informed her father that she would call him later.  Father stated understanding.

## 2013-03-08 ENCOUNTER — Telehealth (HOSPITAL_COMMUNITY): Payer: Self-pay | Admitting: *Deleted

## 2013-03-08 ENCOUNTER — Inpatient Hospital Stay (HOSPITAL_COMMUNITY)
Admission: AD | Admit: 2013-03-08 | Discharge: 2013-03-14 | DRG: 430 | Disposition: A | Discharge status: Home / Self Care | Payer: BC Managed Care – PPO | Source: Intra-hospital | Attending: Psychiatry | Admitting: Psychiatry

## 2013-03-08 ENCOUNTER — Encounter (HOSPITAL_COMMUNITY): Payer: Self-pay | Admitting: Rehabilitation

## 2013-03-08 DIAGNOSIS — J019 Acute sinusitis, unspecified: Secondary | ICD-10-CM

## 2013-03-08 DIAGNOSIS — F411 Generalized anxiety disorder: Secondary | ICD-10-CM

## 2013-03-08 DIAGNOSIS — Z79899 Other long term (current) drug therapy: Secondary | ICD-10-CM

## 2013-03-08 DIAGNOSIS — R51 Headache: Secondary | ICD-10-CM

## 2013-03-08 DIAGNOSIS — F332 Major depressive disorder, recurrent severe without psychotic features: Principal | ICD-10-CM

## 2013-03-08 DIAGNOSIS — F32A Depression, unspecified: Secondary | ICD-10-CM

## 2013-03-08 DIAGNOSIS — R45851 Suicidal ideations: Secondary | ICD-10-CM

## 2013-03-08 DIAGNOSIS — L709 Acne, unspecified: Secondary | ICD-10-CM

## 2013-03-08 DIAGNOSIS — F1310 Sedative, hypnotic or anxiolytic abuse, uncomplicated: Secondary | ICD-10-CM

## 2013-03-08 DIAGNOSIS — J690 Pneumonitis due to inhalation of food and vomit: Secondary | ICD-10-CM

## 2013-03-08 DIAGNOSIS — F329 Major depressive disorder, single episode, unspecified: Secondary | ICD-10-CM

## 2013-03-08 DIAGNOSIS — L708 Other acne: Secondary | ICD-10-CM

## 2013-03-08 DIAGNOSIS — F1320 Sedative, hypnotic or anxiolytic dependence, uncomplicated: Secondary | ICD-10-CM

## 2013-03-08 DIAGNOSIS — F1220 Cannabis dependence, uncomplicated: Secondary | ICD-10-CM

## 2013-03-08 DIAGNOSIS — Z87440 Personal history of urinary (tract) infections: Secondary | ICD-10-CM

## 2013-03-08 DIAGNOSIS — F1210 Cannabis abuse, uncomplicated: Secondary | ICD-10-CM

## 2013-03-08 MED ORDER — ACETAMINOPHEN 325 MG PO TABS
650.0000 mg | ORAL_TABLET | Freq: Four times a day (QID) | ORAL | Status: DC | PRN
  Administered 2013-03-12: 650 mg via ORAL

## 2013-03-08 MED ORDER — TRAZODONE HCL 50 MG PO TABS
50.0000 mg | ORAL_TABLET | Freq: Every evening | ORAL | Status: DC | PRN
  Administered 2013-03-08 – 2013-03-13 (×3): 50 mg via ORAL
  Filled 2013-03-08 (×17): qty 1

## 2013-03-08 MED ORDER — ALUM & MAG HYDROXIDE-SIMETH 200-200-20 MG/5ML PO SUSP: 30.0000 mL | ORAL | Status: DC | PRN

## 2013-03-08 MED ORDER — MAGNESIUM HYDROXIDE 400 MG/5ML PO SUSP: 30.0000 mL | Freq: Every day | ORAL | Status: DC | PRN

## 2013-03-08 NOTE — Progress Notes (Signed)
Laurie Chaney is a 22 year old patient admitted today due to depression and suicidal ideation.  She reports that she has been living in [place] for the last month and coming home triggered bad memories and a panic attack.  She reports a history of depression and suicidal thoughts since 5th grade, but lately she has had a tentative plan to drive her car "off a mountain".  She denies any drug consistent drug use, stating that she uses marijuana and alcohol only socially.  She denies SI/HI/AVH at this time.

## 2013-03-08 NOTE — ED Notes (Signed)
Report called to Lupita Leash RN at [facility]; awaiting on [employer] for transport.

## 2013-03-08 NOTE — Progress Notes (Signed)
Adult Psychoeducational Group Note  Date:  03/08/2013 Time:  8:00PM Group Topic/Focus:  Wrap-Up Group:   The focus of this group is to help patients review their daily goal of treatment and discuss progress on daily workbooks.  Participation Level:  Active  Participation Quality:  Appropriate and Attentive  Affect:  Appropriate  Cognitive:  Alert and Appropriate  Insight: Appropriate  Engagement in Group:  Engaged  Modes of Intervention:  Discussion  Additional Comments:  Pt. Was attentive and appropriate during tonight's group discussion. Pt was able to introduce herself to group. Pt. Stated that she would like to work on setting goals and learning different coping skill to apply to her everyday life.   Bing Plume D 03/08/2013, 9:21 PM

## 2013-03-08 NOTE — Progress Notes (Signed)
Initial Interdisciplinary Treatment Plan  PATIENT STRENGTHS: (choose at least two) Average or above average intelligence Motivation for treatment/growth  PATIENT STRESSORS: Occupational concerns   PROBLEM LIST: Problem List/Patient Goals Date to be addressed Date deferred Reason deferred Estimated date of resolution  Depression      Suicidal ideation                                                 DISCHARGE CRITERIA:  Ability to meet basic life and health needs Motivation to continue treatment in a less acute level of care  PRELIMINARY DISCHARGE PLAN: Attend aftercare/continuing care group Return to previous living arrangement  PATIENT/FAMIILY INVOLVEMENT: This treatment plan has been presented to and reviewed with the patient, Laurie Chaney..  The patient and family have been given the opportunity to ask questions and make suggestions.  Angela Adam 03/08/2013, 5:31 PM

## 2013-03-08 NOTE — BH Assessment (Signed)
Assessment Note   Laurie Chaney is a 22 y.o. female who presents via IVC petition by parents for SI threats and Depression.  Pt brought in by EMS.  Pt says--"I look at my life and feel that its useless".  Pt reports the following: pt resides in [place] and came to [place] to visit her parents on 03/04/13 and states she was fine until 03/06/13.  Pt says she thought of her current situation, (1) financial issues and (2) ex-boyfriend who is and heroin addict, continues to try to rekindle the relationship.  Pt says she became sad and wanted to harm herself.  Pt tells this writer she has no intent of harming self.  Pt says--"[place] has a lot of bad memories".    Pt has hx of cutting, last cut 6 mos ago and has recent thoughts of cutting self.  Pt admits using THC, 1 gram wkly, last used 2 wks ago.  Pt denies any other drug use.  Pt no longer endorses any SI and can contract for safety and return to her parents home.  Pt says parents are supportive.   Axis I: Major Depression, single episode Axis II: Deferred Axis III:  Past Medical History  Diagnosis Date  . Acne   . Migraine   . Anxiety   . Seizures     had 6 none since 4th grade  . Depression     hx of wellbutrin and lexapro use  . History of recurrent UTIs     as a child had evaluation  . History of varicella   . Overdose 07/23/2011  . Respiratory failure, acute 07/23/2011  . Gonorrhea   . Chlamydia 11/2012   Axis IV: economic problems, other psychosocial or environmental problems and problems related to social environment Axis V: 41-50 serious symptoms  Past Medical History:  Past Medical History  Diagnosis Date  . Acne   . Migraine   . Anxiety   . Seizures     had 6 none since 4th grade  . Depression     hx of wellbutrin and lexapro use  . History of recurrent UTIs     as a child had evaluation  . History of varicella   . Overdose 07/23/2011  . Respiratory failure, acute 07/23/2011  . Gonorrhea   .  Chlamydia 11/2012    Past Surgical History  Procedure Laterality Date  . Tympanostomy tube placement      age 26-2  . Colposcopy    . Gynecologic cryosurgery      for cervicitis not dysplasia    Family History:  Family History  Problem Relation Age of Onset  . Hypertension Mother   . Depression Mother   . Anxiety disorder Mother   . Drug abuse Mother   . Diabetes Paternal Grandmother   . Ovarian cancer Maternal Grandmother     Social History:  reports that she has quit smoking. She does not have any smokeless tobacco history on file. She reports that  drinks alcohol. She reports that she does not use illicit drugs.  Additional Social History:  Alcohol / Drug Use Pain Medications: See MAR  Prescriptions: See MAR  Over the Counter: See MAR  History of alcohol / drug use?: Yes Longest period of sobriety (when/how long): None  Withdrawal Symptoms: Other (Comment) Substance #1 Name of Substance 1: THC  1 - Age of First Use: 14 yof  1 - Amount (size/oz): 1 gram  1 - Frequency: wkly  1 -  Duration: On-going  1 - Last Use / Amount: 2 wks ago   CIWA: CIWA-Ar BP: 103/65 mmHg Pulse Rate: 79 COWS:    Allergies: No Known Allergies  Home Medications:  (Not in a hospital admission)  OB/GYN Status:  Patient's last menstrual period was 02/27/2013.  General Assessment Data [facility]: [facility] Living Arrangements: Alone (Currently visiting parents) Can pt return to current living arrangement?: Yes Admission Status: Involuntary Is patient capable of signing voluntary admission?: No Transfer from: Acute Hospital Referral Source: MD  Education Status Is patient currently in school?: No Current Grade: None Highest grade of school patient has completed: None  Name of school: None  Contact person: None   Risk to self Suicidal Ideation: No-Not Currently/Within Last 6 Months Suicidal Intent: No-Not Currently/Within Last 6 Months Is patient at risk for suicide?:  No Suicidal Plan?: No-Not Currently/Within Last 6 Months Access to Means: No What has been your use of drugs/alcohol within the last 12 months?: Pt using THC wkly  Previous Attempts/Gestures: No How many times?: 0 Other Self Harm Risks: None  Triggers for Past Attempts: None known Intentional Self Injurious Behavior: Cutting Comment - Self Injurious Behavior: Hx of cutting, last cut 6 mos ago  Family Suicide History: No Recent stressful life event(s): Financial Problems;Other (Comment) (Ex-boyfriend continues attempts to rekindle relationship ) Persecutory voices/beliefs?: No Depression: Yes Depression Symptoms: Loss of interest in usual pleasures;Feeling worthless/self pity Substance abuse history and/or treatment for substance abuse?: Yes Suicide prevention information given to non-admitted patients: Not applicable  Risk to Others Homicidal Ideation: No Thoughts of Harm to Others: No Current Homicidal Intent: No Current Homicidal Plan: No Access to Homicidal Means: No Identified Victim: None  History of harm to others?: No Assessment of Violence: None Noted Violent Behavior Description: None  Does patient have access to weapons?: No Criminal Charges Pending?: No Does patient have a court date: No  Psychosis Hallucinations: None noted Delusions: None noted  Mental Status Report Appear/Hygiene:  (Appropriate ) Eye Contact: Good Motor Activity: Unremarkable Speech: Logical/coherent Level of Consciousness: Alert Mood: Depressed;Anhedonia;Sad Affect: Depressed;Sad Anxiety Level: None Thought Processes: Coherent;Relevant Judgement: Unimpaired Orientation: Person;Place;Time;Situation Obsessive Compulsive Thoughts/Behaviors: None  Cognitive Functioning Concentration: Normal Memory: Recent Intact;Remote Intact IQ: Average Insight: Fair Impulse Control: Fair Appetite: Fair Weight Loss: 0 Weight Gain: 0 Sleep: No Change Total Hours of Sleep: 6 Vegetative Symptoms:  None  ADLScreening [facility] Assessment Services) Patient's cognitive ability adequate to safely complete daily activities?: Yes Patient able to express need for assistance with ADLs?: Yes Independently performs ADLs?: Yes (appropriate for developmental age)  Abuse/Neglect [facility]) Physical Abuse: Denies Verbal Abuse: Denies Sexual Abuse: Denies  Prior Inpatient Therapy Prior Inpatient Therapy: Yes Prior Therapy Dates: 2008 Prior Therapy Facilty/Provider([facility]  Reason for Treatment: SI/Depression   Prior Outpatient Therapy Prior Outpatient Therapy: Yes Prior Therapy Dates: Current  Prior Therapy Facilty/Provider(s): [facility]  Reason for Treatment: Med Mgt   ADL Screening (condition at time of admission) Patient's cognitive ability adequate to safely complete daily activities?: Yes Is the patient deaf or have difficulty hearing?: No Does the patient have difficulty seeing, even when wearing glasses/contacts?: No Does the patient have difficulty concentrating, remembering, or making decisions?: No Patient able to express need for assistance with ADLs?: Yes Does the patient have difficulty dressing or bathing?: No Independently performs ADLs?: Yes (appropriate for developmental age) Does the patient have difficulty walking or climbing stairs?: No Weakness of Legs: None Weakness of Arms/Hands: None  Home Assistive Devices/Equipment Home  Assistive Devices/Equipment: None  Therapy Consults (therapy consults require a physician order) PT Evaluation Needed: No OT Evalulation Needed: No SLP Evaluation Needed: No Abuse/Neglect Assessment (Assessment to be complete while patient is alone) Physical Abuse: Denies Verbal Abuse: Denies Sexual Abuse: Denies Exploitation of patient/patient's resources: Denies Self-Neglect: Denies Values / Beliefs Cultural Requests During Hospitalization: None Spiritual Requests During Hospitalization: None Consults Spiritual Care Consult Needed:  No Social Work Consult Needed: No Merchant navy officer (For Healthcare) Advance Directive: Patient does not have advance directive;Patient would not like information Pre-existing out of facility DNR order (yellow form or pink MOST form): No Nutrition Screen- [facility] Adult/[facility] Patient's home diet: Regular  Additional Information 1:1 In Past 12 Months?: No CIRT Risk: No Elopement Risk: No Does patient have medical clearance?: Yes     Disposition:  Disposition Initial Assessment Completed for this Encounter: Yes Disposition of Patient: Inpatient treatment program;Referred to (Pt accepted by Donell Sievert, PA, pending 500 hall bed ) Type of inpatient treatment program: Adult Patient referred to: Other (Comment) (Accepted by Donell Sievert, PA, pending 500 hall bed)  On Site Evaluation by:   Reviewed with Physician:     Murrell Redden 03/08/2013 2:20 AM

## 2013-03-08 NOTE — Progress Notes (Signed)
D: Information was received from pt,s father regarding the Abx pt was on @ home. A: copy of the information is on the front of the pt,s chart. Laurie Sievert PA also notified. Order received for a urine culture & pt turned in the urine. Pt stated that she is no longer having S/S of vaginitis so order for  Abx that the father is insisting the pt needs is not yet written @ this time pending the result of the urine culture.

## 2013-03-09 ENCOUNTER — Telehealth (HOSPITAL_COMMUNITY): Payer: Self-pay | Admitting: *Deleted

## 2013-03-09 DIAGNOSIS — F1994 Other psychoactive substance use, unspecified with psychoactive substance-induced mood disorder: Secondary | ICD-10-CM

## 2013-03-09 DIAGNOSIS — F1911 Other psychoactive substance abuse, in remission: Secondary | ICD-10-CM

## 2013-03-09 DIAGNOSIS — F1320 Sedative, hypnotic or anxiolytic dependence, uncomplicated: Secondary | ICD-10-CM

## 2013-03-09 DIAGNOSIS — F411 Generalized anxiety disorder: Secondary | ICD-10-CM

## 2013-03-09 MED ORDER — CHLORDIAZEPOXIDE HCL 25 MG PO CAPS
25.0000 mg | ORAL_CAPSULE | Freq: Four times a day (QID) | ORAL | Status: AC | PRN
  Filled 2013-03-09: qty 1

## 2013-03-09 MED ORDER — HYDROXYZINE HCL 25 MG PO TABS
25.0000 mg | ORAL_TABLET | Freq: Four times a day (QID) | ORAL | Status: AC | PRN
  Administered 2013-03-09: 25 mg via ORAL
  Filled 2013-03-09: qty 1

## 2013-03-09 MED ORDER — ONDANSETRON 4 MG PO TBDP: 4.0000 mg | ORAL_TABLET | Freq: Four times a day (QID) | ORAL | Status: AC | PRN

## 2013-03-09 MED ORDER — CHLORDIAZEPOXIDE HCL 25 MG PO CAPS
ORAL_CAPSULE | ORAL | Status: AC
  Administered 2013-03-09: 25 mg via ORAL
  Filled 2013-03-09: qty 2

## 2013-03-09 MED ORDER — OLANZAPINE-FLUOXETINE HCL 3-25 MG PO CAPS
1.0000 | ORAL_CAPSULE | Freq: Every evening | ORAL | Status: DC
  Administered 2013-03-09 – 2013-03-13 (×5): 1 via ORAL
  Filled 2013-03-09 (×7): qty 1

## 2013-03-09 MED ORDER — CHLORDIAZEPOXIDE HCL 25 MG PO CAPS
25.0000 mg | ORAL_CAPSULE | Freq: Every day | ORAL | Status: AC
  Administered 2013-03-12: 25 mg via ORAL

## 2013-03-09 MED ORDER — LAMOTRIGINE 100 MG PO TABS
100.0000 mg | ORAL_TABLET | Freq: Every morning | ORAL | Status: DC
  Administered 2013-03-09: 100 mg via ORAL
  Filled 2013-03-09 (×4): qty 1

## 2013-03-09 MED ORDER — CHLORDIAZEPOXIDE HCL 25 MG PO CAPS
50.0000 mg | ORAL_CAPSULE | Freq: Once | ORAL | Status: AC
  Administered 2013-03-09: 50 mg via ORAL

## 2013-03-09 MED ORDER — CHLORDIAZEPOXIDE HCL 25 MG PO CAPS
25.0000 mg | ORAL_CAPSULE | Freq: Four times a day (QID) | ORAL | Status: AC
  Administered 2013-03-09 – 2013-03-10 (×3): 25 mg via ORAL
  Filled 2013-03-09 (×3): qty 1

## 2013-03-09 MED ORDER — THIAMINE HCL 100 MG/ML IJ SOLN
100.0000 mg | Freq: Once | INTRAMUSCULAR | Status: AC
  Administered 2013-03-09: 100 mg via INTRAMUSCULAR

## 2013-03-09 MED ORDER — CHLORDIAZEPOXIDE HCL 25 MG PO CAPS
25.0000 mg | ORAL_CAPSULE | Freq: Three times a day (TID) | ORAL | Status: AC
  Administered 2013-03-10 – 2013-03-11 (×3): 25 mg via ORAL
  Filled 2013-03-09 (×3): qty 1

## 2013-03-09 MED ORDER — ADULT MULTIVITAMIN W/MINERALS CH
1.0000 | ORAL_TABLET | Freq: Every day | ORAL | Status: DC
  Administered 2013-03-09 – 2013-03-14 (×6): 1 via ORAL
  Filled 2013-03-09 (×7): qty 1

## 2013-03-09 MED ORDER — LOPERAMIDE HCL 2 MG PO CAPS: 2.0000 mg | ORAL_CAPSULE | ORAL | Status: AC | PRN

## 2013-03-09 MED ORDER — VITAMIN B-1 100 MG PO TABS
100.0000 mg | ORAL_TABLET | Freq: Every day | ORAL | Status: DC
  Administered 2013-03-10 – 2013-03-14 (×5): 100 mg via ORAL
  Filled 2013-03-09 (×7): qty 1

## 2013-03-09 MED ORDER — CHLORDIAZEPOXIDE HCL 25 MG PO CAPS
25.0000 mg | ORAL_CAPSULE | ORAL | Status: AC
  Administered 2013-03-12: 25 mg via ORAL
  Filled 2013-03-09: qty 1

## 2013-03-09 NOTE — Progress Notes (Signed)
Patient ID: Laurie Chaney, female   DOB: [birth year], 22 y.o.   MRN: [id number] D: Took over patient's care @ 2330. Patient in bed sleeping. Respiration regular and unlabored. No sign of distress noted at this time A: 15 mins checks for safety. R: Patient remains asleep. Pt is safe.

## 2013-03-09 NOTE — Progress Notes (Signed)
Patient ID: Laurie Chaney, female   DOB: Jun 06, 1991, 22 y.o.   MRN: [id number] D: Patient denies SI/HI/AVH and pain. Pt presented with depressed mood and sad affect.  Pt rates depression as 3 and anxiety was 3 out of 0-10 scale.  Pt attended evening karaoke group and was supportive of peers. Pt denies any needs or concerns.  Cooperative with assessment. No acute distressed noted at this time.   A: Met with pt 1:1. Medications administered as prescribed. Writer encouraged pt to discuss feelings. Pt encouraged to come to staff with any question or concerns. 15 minutes checks for safety.  R: Patient remains safe. She is complaint with medications and denies any adverse reaction. Continue current POC.

## 2013-03-09 NOTE — [facility] Suicide Risk Assessment (Signed)
Suicide Risk Assessment  Admission Assessment     Nursing information obtained from:    Demographic factors:    Current Mental Status:    Loss Factors:    Historical Factors:    Risk Reduction Factors:     CLINICAL FACTORS:   Severe Anxiety and/or Agitation Depression:   Anhedonia Comorbid alcohol abuse/dependence Hopelessness Impulsivity Insomnia Alcohol/Substance Abuse/Dependencies Previous Psychiatric Diagnoses and Treatments  COGNITIVE FEATURES THAT CONTRIBUTE TO RISK:  Closed-mindedness    SUICIDE RISK:   Mild:  Suicidal ideation of limited frequency, intensity, duration, and specificity.  There are no identifiable plans, no associated intent, mild dysphoria and related symptoms, good self-control (both objective and subjective assessment), few other risk factors, and identifiable protective factors, including available and accessible social support.  PLAN OF CARE:1. Admit for crisis management and stabilization. 2. Medication management to reduce current symptoms to base line and improve the     patient's overall level of functioning 3. Treat health problems as indicated. 4. Develop treatment plan to decrease risk of relapse upon discharge and the need for     readmission. 5. Psycho-social education regarding relapse prevention and self care. 6. Health care follow up as needed for medical problems. 7. Restart home medications where appropriate.   I certify that inpatient services furnished can reasonably be expected to improve the patient's condition.  Grizelda Piscopo,MD 03/09/2013, 11:07 AM

## 2013-03-09 NOTE — Progress Notes (Signed)
[facility] Group Notes:  (Nursing/MHT/Case Management/Adjunct)  Date:  03/09/2013  Time:  12:16 PM  Type of Therapy:  Therapeutic Group  Participation Level:  Active  Participation Quality:  Appropriate and Sharing  Affect:  Appropriate  Cognitive:  Alert and Appropriate  Insight:  Appropriate and Good  Engagement in Group:  Engaged and Supportive  Modes of Intervention:  Discussion, Education, Exploration and Support  Summary of Progress/Problems:Pt was very engaged in group and mentioned enjoying reminiscing about her childhood. Her favorite memories include riding horses, taking care of animals, and different adventures.   Reynolds Bowl 03/09/2013, 12:16 PM
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
## 2013-03-09 NOTE — Progress Notes (Signed)
D: Patient denies SI/HI or AVH.  She has a depressed mood and affect.  Pt. Reports that her sleep is poor and appetite is improving.  She states that her energy level is normal and rates her depression/hopelessness as 2/10 (1 low 10 high).  Pt. Is attending groups and interacting appropriately within the milieu.  A: Patient given emotional support from RN. Patient encouraged to come to staff with concerns and/or questions. Patient's medication routine continued. Patient's orders and plan of care reviewed.   R: Patient remains appropriate and cooperative. Will continue to monitor patient q15 minutes for safety.  Pt. Reports she is going to try and stay focused on the present when she returns home in order to prevent relapse.

## 2013-03-09 NOTE — [facility] Group Notes (Signed)
[facility] LCSW Aftercare Discharge Planning Group Note   03/09/2013   8:45 AM  Participation Quality:  Alert and Appropriate   Mood/Affect:  Appropriate and Calm  Depression Rating:  1  Anxiety Rating: 7-8  Thoughts of Suicide:  Pt denies SI/HI  Will you contract for safety?   Yes  Current AVH:  Pt denies  Plan for Discharge/Comments:  Pt attended discharge planning group and actively participated in group.  CSW provided pt with today's workbook.  Pt states that she lives in [place], [place] and was visiting her family in [place] with she was triggered by past memories, causing SI and anxiety.  Pt states that she will return to [facility] upon d/c.  Pt states that she is followed by [facility] for medication management.  CSW confirmed pt's next appointment.  Pt open to referrals for therapy in [facility]; CSW will assess for appropriate referrals.  No further needs voiced by pt at this time.    Transportation Means: Pt reports access to transportation  Supports: Pt states that her family is supportive  Reyes Ivan, LCSWA /03/09/2013 10:38 AM

## 2013-03-09 NOTE — [facility] Group Notes (Signed)
[facility] LCSW Group Therapy  Living a Balance Life 1:15 - 2:30 PM  03/09/2013  3:19 PM   Type of Therapy:  Group Therapy  Participation Level:  Active  Participation Quality:  Attentive  Affect:  Appropriate  Cognitive:  Appropriate  Insight:  Developing/Improving and Engaged  Engagement in Therapy:  Developing/Improving Engaged  Modes of Intervention:  Discussion, Education, Exploration, Problem-Solving, Rapport Building, Support   Summary of Progress/Problems:  Patient shared she needs to spend more time on herself in an effort to balance her life.  [place].  Wynn Banker 03/09/2013 3:19 PM

## 2013-03-09 NOTE — Progress Notes (Signed)
Adult Psychoeducational Group Note  Date:  03/09/2013 Time:  4:49 PM  Group Topic/Focus:  Overcoming Stress:   The focus of this group is to define stress and help patients assess their triggers.  Participation Level:  Active  Participation Quality:  Appropriate  Affect:  Appropriate  Cognitive:  Alert and Appropriate  Insight: Appropriate and Good  Engagement in Group:  Engaged  Modes of Intervention:  Discussion, Education, Exploration and Support  Additional Comments: Pt was an active participant and shared that at age 56 she has a hard time not focusing on the time she feels like she has wasted. The future seems uncertain to her.  Reynolds Bowl 03/09/2013, 4:49 PM

## 2013-03-09 NOTE — Progress Notes (Signed)
Pt attended Karaoke group and participated by singing a song.  

## 2013-03-10 ENCOUNTER — Telehealth (HOSPITAL_COMMUNITY): Payer: Self-pay | Admitting: *Deleted

## 2013-03-10 DIAGNOSIS — F1320 Sedative, hypnotic or anxiolytic dependence, uncomplicated: Secondary | ICD-10-CM | POA: Diagnosis present

## 2013-03-10 LAB — URINE CULTURE: Culture: NO GROWTH

## 2013-03-10 MED ORDER — AZITHROMYCIN 500 MG PO TABS
1000.0000 mg | ORAL_TABLET | Freq: Once | ORAL | Status: AC
  Administered 2013-03-10: 1000 mg via ORAL
  Filled 2013-03-10: qty 2

## 2013-03-10 MED ORDER — LAMOTRIGINE 100 MG PO TABS
100.0000 mg | ORAL_TABLET | Freq: Every morning | ORAL | Status: DC
  Administered 2013-03-10 – 2013-03-14 (×5): 100 mg via ORAL
  Filled 2013-03-10 (×6): qty 1

## 2013-03-10 NOTE — Progress Notes (Addendum)
[facility] Group Notes:  (Nursing/MHT/Case Management/Adjunct)  Date:  03/10/2013  Time:  6:48 PM  Type of Therapy:  Therapeutic Activity  Participation Level:  Active  Participation Quality:  Appropriate and Attentive  Affect:  Appropriate  Cognitive:  Appropriate  Insight:  Appropriate and Good  Engagement in Group:  Engaged  Modes of Intervention:  Clarification, Discussion, Exploration and Socialization  Summary of Progress/Problems: Kelia attended, participated, and shared during group. Patient sat in a chair facing the opposite side of another chair and was asked to draw a coping skill and explain the description to the person sitting on the backside of the chair. After giving the description to the person, patient had to listen to the other person and draw the coping skill they had. Patient was asked to share why it is important to communication clearly, what were the differences in the pictures. Patient used communication as a tool to giving the drawer a description of a coping skill that had been drawn.    Karleen Hampshire Brittini 03/10/2013, 6:48 PM

## 2013-03-10 NOTE — Progress Notes (Addendum)
[facility] MD Progress Note  03/10/2013 2:37 PM Laurie Chaney  MRN:  [id number] Subjective:  Patient observed in the hall waiting for groups. She appears guarded during interactions and does readily forward information. The patient appears to minimize her symptoms focusing on seeing places at [facility] being a trigger "to spiral out of control but I'm better now." When asked how she will avoid this in the future patient states "My parents will visit me more in [place]. I will go straight to their house when I come to [place]. [place]."  Diagnosis:   Axis I: Generalized Anxiety Disorder and Substance Induced Mood Disorder Axis II: Borderline Personality Dis. and Cluster B Traits Axis III:  Past Medical History  Diagnosis Date  . Acne   . Migraine   . Anxiety   . Seizures     had 6 none since 4th grade  . Depression     hx of wellbutrin and lexapro use  . History of recurrent UTIs     as a child had evaluation  . History of varicella   . Overdose 07/23/2011  . Respiratory failure, acute 07/23/2011  . Gonorrhea   . Chlamydia 11/2012   Axis IV: economic problems, educational problems, occupational problems, problems related to legal system/crime and problems related to social environment Axis V: 41-50 serious symptoms  ADL's:  Intact  Sleep: Fair  Appetite:  Good  Suicidal Ideation:  Denies Homicidal Ideation:  Denies AEB (as evidenced by):  Psychiatric Specialty Exam: Review of Systems  Constitutional: Negative.   HENT: Negative.   Eyes: Negative.   Genitourinary: Negative.   Musculoskeletal: Negative.   Skin: Negative.   Neurological: Negative.   Endo/Heme/Allergies: Negative.     Blood pressure 92/57, pulse 76, temperature 97.6 F (36.4 C), temperature source Oral, resp. rate 16, height 4\' 11"  (1.499 m), weight 43.999 kg (97 lb), last menstrual period 02/27/2013, SpO2 99.00%.Body mass index is 19.58 kg/(m^2).  General  Appearance: Casual  Eye Contact::  Fair  Speech:  Clear and Coherent  Volume:  Normal  Mood:  Anxious  Affect:  Blunt  Thought Process:  Goal Directed  Orientation:  Full (Time, Place, and Person)  Thought Content:  WDL  Suicidal Thoughts:  No  Homicidal Thoughts:  No  Memory:  Immediate;   Good Recent;   Good Remote;   Good  Judgement:  Impaired  Insight:  Lacking  Psychomotor Activity:  Normal  Concentration:  Fair  Recall:  Good  Akathisia:  No  Handed:  Right  AIMS (if indicated):     Assets:  Communication Skills Desire for Improvement Leisure Time Physical Health Resilience Social Support  Sleep:  Number of Hours: 6.75   Current Medications: Current Facility-Administered Medications  Medication Dose Route Frequency Provider Last Rate Last Dose  . acetaminophen (TYLENOL) tablet 650 mg  650 mg Oral Q6H PRN Kerry Hough, PA-C      . alum & mag hydroxide-simeth (MAALOX/MYLANTA) 200-200-20 MG/5ML suspension 30 mL  30 mL Oral Q4H PRN Kerry Hough, PA-C      . chlordiazePOXIDE (LIBRIUM) capsule 25 mg  25 mg Oral Q6H PRN Verne Spurr, PA-C   25 mg at 03/09/13 1302  . chlordiazePOXIDE (LIBRIUM) capsule 25 mg  25 mg Oral TID Verne Spurr, PA-C   25 mg at 03/10/13 1303   Followed by  . [START ON 03/11/2013] chlordiazePOXIDE (LIBRIUM) capsule 25 mg  25 mg Oral BH-qamhs Lloyd Huger  M[place]burn, PA-C       Followed by  . [START ON 03/13/2013] chlordiazePOXIDE (LIBRIUM) capsule 25 mg  25 mg Oral Daily Verne Spurr, PA-C      . hydrOXYzine (ATARAX/VISTARIL) tablet 25 mg  25 mg Oral Q6H PRN Verne Spurr, PA-C   25 mg at 03/09/13 1446  . lamoTRIgine (LAMICTAL) tablet 100 mg  100 mg Oral q morning - 10a Fransisca Kaufmann, NP   100 mg at 03/10/13 0806  . loperamide (IMODIUM) capsule 2-4 mg  2-4 mg Oral PRN Verne Spurr, PA-C      . magnesium hydroxide (MILK OF MAGNESIA) suspension 30 mL  30 mL Oral Daily PRN Kerry Hough, PA-C      . multivitamin with minerals tablet 1 tablet  1 tablet  Oral Daily Verne Spurr, PA-C   1 tablet at 03/10/13 0805  . OLANZapine-FLUoxetine (SYMBYAX) 3-25 MG per capsule 1 capsule  1 capsule Oral QPM Verne Spurr, PA-C   1 capsule at 03/09/13 1850  . ondansetron (ZOFRAN-ODT) disintegrating tablet 4 mg  4 mg Oral Q6H PRN Verne Spurr, PA-C      . thiamine (VITAMIN B-1) tablet 100 mg  100 mg Oral Daily Verne Spurr, PA-C   100 mg at 03/10/13 0805  . traZODone (DESYREL) tablet 50 mg  50 mg Oral QHS,MR X 1 Kerry Hough, PA-C   50 mg at 03/09/13 2216    Lab Results: No results found for this or any previous visit (from the past 48 hour(s)).  Physical Findings: AIMS: Facial and Oral Movements Muscles of Facial Expression: None, normal Lips and Perioral Area: None, normal Jaw: None, normal Tongue: None, normal,Extremity Movements Upper (arms, wrists, hands, fingers): None, normal Lower (legs, knees, ankles, toes): None, normal, Trunk Movements Neck, shoulders, hips: None, normal, Overall Severity Severity of abnormal movements (highest score from questions above): None, normal Incapacitation due to abnormal movements: None, normal Patient's awareness of abnormal movements (rate only patient's report): No Awareness, Dental Status Current problems with teeth and/or dentures?: No Does patient usually wear dentures?: No  CIWA:  CIWA-Ar Total: 1 COWS:     Treatment Plan Summary: Daily contact with patient to assess and evaluate symptoms and progress in treatment Medication management  Plan: Continue crisis management and stabilization.  Medication management: Continue librium detox protocol to assist patient in getting off benzos. Continue home medications as ordered.  Encouraged patient to attend groups and participate in group counseling sessions and activities.  Discharge plan in progress.  Address health issues: Vitals reviewed and stable. Reported today that patient had a positive culture for chlamydia at [facility] and needs  treatment. Ordered Azithromycin one gram once.  Continue current treatment plan.   Medical Decision Making Problem Points:  Established problem, stable/improving (1) and Review of psycho-social stressors (1) Data Points:  Review of medication regiment & side effects (2)  I certify that inpatient services furnished can reasonably be expected to improve the patient's condition.   Alvan Culpepper NP-C 03/10/2013, 2:37 PM

## 2013-03-10 NOTE — Progress Notes (Signed)
This nurse received pc from individual that stated she was RN named Cherly Anderson and she was employed at [facility] and she was calling about pt Laurie Chaney-that this pt was treated at Urgent Care July 12th and cx came back pos for chlamydia. This info was passed opn to Fransisca Kaufmann NP ( practitioner responsible for 500 hall pts)  by this RN at 1505 .

## 2013-03-10 NOTE — Tx Team (Signed)
Interdisciplinary Treatment Plan Update (Adult)  Date: 03/10/2013  Time Reviewed:  9:45 AM  Progress in Treatment: Attending groups: Yes Participating in groups:  Yes Taking medication as prescribed:  Yes Tolerating medication:  Yes Family/Significant othe contact made: CSW will make attempts Patient understands diagnosis:  Yes Discussing patient identified problems/goals with staff:  Yes Medical problems stabilized or resolved:  Yes Denies suicidal/homicidal ideation: Yes Issues/concerns per patient self-inventory:  Yes Other:  New problem(s) identified: N/A  Discharge Plan or Barriers: Pt has follow up scheduled at [facility] for medication management.  CSW will refer pt to [facility] in [place], [place] for therapy/support groups.    Reason for Continuation of Hospitalization: Anxiety Depression Medication Stabilization Suicidal Ideation  Comments: N/A  Estimated length of stay: 2-3 days  For review of initial/current patient goals, please see plan of care.  Attendees: Patient:     Family:     Physician:  03/10/2013 9:56 AM   Nursing:   Lowella Grip, RN 03/10/2013 9:56 AM   Clinical Social Worker:  Reyes Ivan, LCSWA 03/10/2013 9:56 AM   Other: Fransisca Kaufmann, NP 03/10/2013 9:56 AM   Other:  Frankey Shown, MA care coordination 03/10/2013 9:56 AM   Other:  Juline Patch, LCSW 03/10/2013 9:56 AM   Other:  Onnie Boer, Case manager  03/10/2013 9:56 AM  Other: Tomasita Morrow, care coordination 03/10/2013 9:56 AM  Other:    Other:    Other:    Other:    Other:     Scribe for Treatment Team:   Carmina Miller, 03/10/2013 9:56 AM

## 2013-03-10 NOTE — [facility] Suicide Risk Assessment (Signed)
[facility] INPATIENT:  Family/Significant Other Suicide Prevention Education  Suicide Prevention Education:  Education Completed; Olamide Lahaie - mother [phone]),  (name of family member/significant other) has been identified by the patient as the family member/significant other with whom the patient will be residing, and identified as the person(s) who will aid the patient in the event of a mental health crisis (suicidal ideations/suicide attempt).  With written consent from the patient, the family member/significant other has been provided the following suicide prevention education, prior to the and/or following the discharge of the patient.  The suicide prevention education provided includes the following:  Suicide risk factors  Suicide prevention and interventions  National Suicide Hotline telephone number  [facility] assessment telephone number  [facility] Emergency Assistance 911  [facility] [place] and/or Residential Mobile Crisis Unit telephone number  Request made of family/significant other to:  Remove weapons (e.g., guns, rifles, knives), all items previously/currently identified as safety concern.    Remove drugs/medications (over-the-counter, prescriptions, illicit drugs), all items previously/currently identified as a safety concern.  The family member/significant other verbalizes understanding of the suicide prevention education information provided.  The family member/significant other agrees to remove the items of safety concern listed above.  Carmina Miller 03/10/2013, 11:23 AM

## 2013-03-10 NOTE — [facility] Group Notes (Signed)
[facility] LCSW Aftercare Discharge Planning Group Note   03/10/2013   8:45 AM  Participation Quality:  Alert and Appropriate   Mood/Affect:  Appropriate and Calm  Depression Rating:  0  Anxiety Rating: 2-3  Thoughts of Suicide:  Pt denies SI/HI  Will you contract for safety?   Yes  Current AVH:  Pt denies  Plan for Discharge/Comments:  Pt attended discharge planning group and actively participated in group.  CSW provided pt with today's workbook.  Pt states that she will return to [facility] upon d/c.  Pt has follow up scheduled with [facility] for medication management and therapy.  No further needs voiced by pt at this time.    Transportation Means: Pt reports access to transportation  Supports: Pt states that her family is supportive  Reyes Ivan, LCSWA /03/10/2013 11:12 AM

## 2013-03-10 NOTE — Progress Notes (Signed)
D: Pt denies SI/Depression. Pt reports feeling anxious today. When asked if she was exhibiting any withdrawal symptoms, pt stated, anxiety. Pt compliant with attending groups and taking meds. Pt reports that she have learned how to cope with depression, by walking away from situations and not getting caught up on negative things like she usually does. Pt presents with flat affect and forwards little information. A: Medications administered as ordered per MD. Verbal support given. Pt encouraged to attend groups. 15 minute checks performed for safety. R: pt is cooperative and pleasant. Pt verbalized no adverse reaction to meds. Pt remains safe at this time.

## 2013-03-10 NOTE — [facility] Group Notes (Signed)
[facility] LCSW Group Therapy  03/10/2013  1:15 PM   Type of Therapy:  Group Therapy  Participation Level:  Active  Participation Quality:  Appropriate and Attentive  Affect:  Appropriate, Depressed and Flat  Cognitive:  Alert and Appropriate  Insight:  Developing/Improving and Engaged  Engagement in Therapy:  Developing/Improving and Engaged  Modes of Intervention:  Clarification, Confrontation, Discussion, Education, Exploration, Limit-setting, Orientation, Problem-solving, Rapport Building, Dance movement psychotherapist, Socialization and Support  Summary of Progress/Problems: The topic for today was feelings about relapse.  Pt discussed what relapse prevention is to them and identified triggers that they are on the path to relapse.  Pt processed their feeling towards relapse and was able to relate to peers.  Pt discussed coping skills that can be used for relapse prevention.   Pt shared that for her, relapse gets harder each time for her.  Pt explained that she's been to the hospital numerous times and she gets mad at herself for getting to that point again.  Pt states that she has identified being in [place] as a trigger and can stay well in [employer].  Pt related to peers about loss of a child, sharing that she had a miscarriage before.  Pt also shared that she wishes she knew about her mom's struggle with addiction, as she feels it would have helped her while she struggled with addiction in the past.  Pt actively participated and was engaged in group discussion.    Reyes Ivan, LCSWA 03/10/2013 2:40 PM

## 2013-03-10 NOTE — ED Notes (Addendum)
Noted that pt. was admitted to [facility]. I called Adult inpatient and spoke with Shelda Jakes RN.  I explained to her, that I was trying to contact pt. for treatment.  She asked who I was. I gave her my name, location and phone number and told her I was an Charity fundraiser.  She said she can't verify pt. is there and asked what I needed. I told her that pt. needs treatment for Chlamydia. I told her that pt. was here on 7/12, and that I have been trying to call her. She needs treated with Zithromax 1 gm po. I asked if we need to order the medication. She said their doctor would need to order the medication. I told her she can view the result in EPIC. She said she would take care of it. Vassie Moselle 03/10/2013 DHHS form completed and faxed to the [facility] Department.

## 2013-03-10 NOTE — Progress Notes (Signed)
[facility] MD Progress Note  03/10/2013 1:15 PM Laurie Chaney  MRN:  [id number] Subjective:  "how long am I going to be here?" Diagnosis:  Benzodiazepine dependence   ADL's:  Intact  Sleep: Good  Appetite:  Fair  Suicidal Ideation:  denies Homicidal Ideation:  denies AEB (as evidenced by):patient's report of symptoms  Psychiatric Specialty Exam: Review of Systems  Constitutional: Negative.  Negative for fever, chills, weight loss, malaise/fatigue and diaphoresis.  HENT: Negative for congestion and sore throat.   Eyes: Negative for blurred vision, double vision and photophobia.  Respiratory: Negative for cough, shortness of breath and wheezing.   Cardiovascular: Negative for chest pain, palpitations and PND.  Gastrointestinal: Negative for heartburn, nausea, vomiting, abdominal pain, diarrhea and constipation.  Musculoskeletal: Negative for myalgias, joint pain and falls.  Neurological: Negative for dizziness, tingling, tremors, sensory change, speech change, focal weakness, seizures, loss of consciousness, weakness and headaches.  Endo/Heme/Allergies: Negative for polydipsia. Does not bruise/bleed easily.  Psychiatric/Behavioral: Negative for depression, suicidal ideas, hallucinations, memory loss and substance abuse. The patient is not nervous/anxious and does not have insomnia.     Blood pressure 92/57, pulse 76, temperature 97.6 F (36.4 C), temperature source Oral, resp. rate 16, height 4\' 11"  (1.499 m), weight 43.999 kg (97 lb), last menstrual period 02/27/2013, SpO2 99.00%.Body mass index is 19.58 kg/(m^2).  General Appearance: Disheveled  Eye Contact::  Good  Speech:  Clear and Coherent  Volume:  Normal  Mood:  Anxious and Depressed  Affect:  Congruent  Thought Process:  Goal Directed  Orientation:  Full (Time, Place, and Person)  Thought Content:  WDL  Suicidal Thoughts:  No  Homicidal Thoughts:  No  Memory:  NA  Judgement:  Impaired  Insight:  Lacking  Psychomotor  Activity:  Increased  Concentration:  Fair  Recall:  Poor  Akathisia:  No  Handed:  Right  AIMS (if indicated):     Assets:  Communication Skills Desire for Improvement Housing Physical Health Resilience Social Support  Sleep:  Number of Hours: 6.75   Current Medications: Current Facility-Administered Medications  Medication Dose Route Frequency Provider Last Rate Last Dose  . acetaminophen (TYLENOL) tablet 650 mg  650 mg Oral Q6H PRN Kerry Hough, PA-C      . alum & mag hydroxide-simeth (MAALOX/MYLANTA) 200-200-20 MG/5ML suspension 30 mL  30 mL Oral Q4H PRN Kerry Hough, PA-C      . chlordiazePOXIDE (LIBRIUM) capsule 25 mg  25 mg Oral Q6H PRN Verne Spurr, PA-C   25 mg at 03/09/13 1302  . chlordiazePOXIDE (LIBRIUM) capsule 25 mg  25 mg Oral TID Verne Spurr, PA-C   25 mg at 03/10/13 1303   Followed by  . [START ON 03/11/2013] chlordiazePOXIDE (LIBRIUM) capsule 25 mg  25 mg Oral BH-qamhs Verne Spurr, PA-C       Followed by  . [START ON 03/13/2013] chlordiazePOXIDE (LIBRIUM) capsule 25 mg  25 mg Oral Daily Verne Spurr, PA-C      . hydrOXYzine (ATARAX/VISTARIL) tablet 25 mg  25 mg Oral Q6H PRN Verne Spurr, PA-C   25 mg at 03/09/13 1446  . lamoTRIgine (LAMICTAL) tablet 100 mg  100 mg Oral q morning - 10a Fransisca Kaufmann, NP   100 mg at 03/10/13 0806  . loperamide (IMODIUM) capsule 2-4 mg  2-4 mg Oral PRN Verne Spurr, PA-C      . magnesium hydroxide (MILK OF MAGNESIA) suspension 30 mL  30 mL Oral Daily PRN Kerry Hough, PA-C      .  multivitamin with minerals tablet 1 tablet  1 tablet Oral Daily Verne Spurr, PA-C   1 tablet at 03/10/13 0805  . OLANZapine-FLUoxetine (SYMBYAX) 3-25 MG per capsule 1 capsule  1 capsule Oral QPM Verne Spurr, PA-C   1 capsule at 03/09/13 1850  . ondansetron (ZOFRAN-ODT) disintegrating tablet 4 mg  4 mg Oral Q6H PRN Verne Spurr, PA-C      . thiamine (VITAMIN B-1) tablet 100 mg  100 mg Oral Daily Verne Spurr, PA-C   100 mg at 03/10/13 0805  .  traZODone (DESYREL) tablet 50 mg  50 mg Oral QHS,MR X 1 Kerry Hough, PA-C   50 mg at 03/09/13 2216    Lab Results: No results found for this or any previous visit (from the past 48 hour(s)).  Physical Findings: AIMS: Facial and Oral Movements Muscles of Facial Expression: None, normal Lips and Perioral Area: None, normal Jaw: None, normal Tongue: None, normal,Extremity Movements Upper (arms, wrists, hands, fingers): None, normal Lower (legs, knees, ankles, toes): None, normal, Trunk Movements Neck, shoulders, hips: None, normal, Overall Severity Severity of abnormal movements (highest score from questions above): None, normal Incapacitation due to abnormal movements: None, normal Patient's awareness of abnormal movements (rate only patient's report): No Awareness, Dental Status Current problems with teeth and/or dentures?: No Does patient usually wear dentures?: No  CIWA:  CIWA-Ar Total: 1 COWS:     Treatment Plan Summary: Daily contact with patient to assess and evaluate symptoms and progress in treatment Medication management  Plan: 1. Continue crisis management and stabilization. 2. Medication management to reduce current symptoms to base line and improve patient's overall level of functioning 3. Treat health problems as indicated. 4. Develop treatment plan to decrease risk of relapse upon discharge and the need for  readmission. 5. Psycho-social education regarding relapse prevention and self care. 6. Health care follow up as needed for medical problems. 7. Continue home medications where appropriate. 8. Will continue detox protocol as planned.    Medical Decision Making Problem Points:  Established problem, stable/improving (1) and Review of psycho-social stressors (1) Data Points:  Review or order medicine tests (1) Review of medication regiment & side effects (2)  I certify that inpatient services furnished can reasonably be expected to improve the patient's  condition.  Rona Ravens. Shareen Capwell RPAC 1:20 PM 03/10/2013

## 2013-03-11 NOTE — Progress Notes (Signed)
The focus of this group is to help patients review their daily goal of treatment and discuss progress on daily workbooks. Pt attended the evening group session and responded to all discussion prompts from the Writer. Pt shared that she had a good day, the highlight of which was "frolicking outside in the courtyard." Pt reported how wonderful it was to be able to go outside, which many of her peers agreed with. Pt mentioned having no additional needs from Nursing Staff this evening when asked. Pt's affect was bright.

## 2013-03-11 NOTE — Progress Notes (Signed)
Date: 03/11/2013 Time:  0930   Goal Setting Purpose of Group: To be able to set a goal that is measurable and that can be accomplished in one day Participation Level: Did not attend  Dione Housekeeper

## 2013-03-11 NOTE — Progress Notes (Signed)
D Zoelle remains sad, quiet and isolative, as she stays in her bed, in her room most of the day. SHe is seen walking around the halls, when she does leave her room, wearing her blanket around her shoulders draped over her like a shawl.She wears green hospital scrubs and is encouraged to bathe and dress , although she does not do this.    A SHe is depressed, flat and makes little to no eye contact.She does take her meds as scheduled.    R Safety is in place and POC moves forward.

## 2013-03-11 NOTE — Progress Notes (Signed)
Patient ID: Laurie Chaney, female   DOB: September 16, 1990, 22 y.o.   MRN: [id number] D: Patient denies SI/HI/AVH and pain. Pt seem depressed mood and sad affect.  Pt rates depression as 0 and anxiety was 3 out of 0-10 scale. Pt stated increased anxiety about how she will cope with life after discharge. Pt attended evening wrap up group and was engaged in discussion. Pt stayed in room rest of the evening with little interaction with peers. Pt denies any needs or concerns.  Cooperative with assessment. No acute distressed noted at this time.   A: Met with pt 1:1. Medications administered as prescribed. Writer encouraged pt to use copping skills learned here and follow up with therapist. Writer encouraged pt to discuss feelings. Pt encouraged to come to staff with any question or concerns. 15 minutes checks for safety.  R: Patient remains safe. She is complaint with medications and denies any adverse reaction. Continue current POC.

## 2013-03-11 NOTE — Progress Notes (Signed)
 .  Psychoeducational Group Note    Date: 03/11/2013 Time:  0930   Goal Setting Purpose of Group: To be able to set a goal that is measurable and that can be accomplished in one day Participation Level:  Active  Participation Quality:  Appropriate  Affect:  Appropriate  Cognitive:  Alert  Insight:  Improving  Engagement in Group:  engaged  Additional Comments:  Pt stated her goal was not to isolate or to turn to destructive bheaviors of using drugs or cutting on herself.   Dione Housekeeper

## 2013-03-11 NOTE — Progress Notes (Signed)
Psychoeducational Group Note  Date: 03/11/2013 Time:  1015  Group Topic/Focus:  Identifying Needs:   The focus of this group is to help patients identify their personal needs that have been historically problematic and identify healthy behaviors to address their needs.  Participation Level:  Active  Participation Quality:  Appropriate  Affect:  Appropriate  Cognitive:  Alert  Insight: improving  Engagement in Group:  Engaged  Additional Comments: Good participation   Daisey Caloca A 

## 2013-03-11 NOTE — Progress Notes (Signed)
Adult Psychoeducational Group Note  Date:  03/10/2013 Time:  2000  Group Topic/Focus:  Wrap-Up Group:   The focus of this group is to help patients review their daily goal of treatment and discuss progress on daily workbooks.  Participation Level:  Active  Participation Quality:  Appropriate  Affect:  Appropriate  Cognitive:  Alert  Insight: Appropriate  Engagement in Group:  Engaged  Modes of Intervention:  Discussion  Additional Comments:  Pt stated that she had a good day today, besides the separation anxiety that she is feeling from being away from her cat. The adjustment to the medication has been good for her and she feels comfortable being here.  Kaleen Odea R 03/11/2013, 5:44 AM

## 2013-03-11 NOTE — [facility] Group Notes (Signed)
[facility] Group Notes:  (Clinical Social Work)  03/11/2013   3:00-4:00PM  Summary of Progress/Problems:   The main focus of today's process group was for the patient to identify something in their life that led to their hospitalization that they would like to change, then to discuss their motivation to change.  The Stages of Change were explained to the group, then each patient identified where they are in that process.  A scaling question was used with motivation interviewing to determine the patient's current motivation to change the identified behavior (1-10, low to high). The patient expressed that she performs a lot of negative self-talk, that she has used cutting and drugs/alcohol to cope with her problems before, that she has a lot of ruminations, and that she has often been suicidal and felt the family would be better off without her as a burden.  She feels she is in the Decision-Making process, says she wants help and wants to do better, but is frightened.  Others in the group encouraged her to not be hard on herself, but to let herself take time in that decision, that it is not an instanteous event that happens.  Type of Therapy:  Process Group  Participation Level:  Active  Participation Quality:  Attentive, Sharing and Supportive  Affect:  Blunted and Depressed  Cognitive:  Oriented  Insight:  Engaged  Engagement in Therapy:  Engaged  Modes of Intervention:  Education, Motivational Interviewing   Ambrose Mantle, LCSW 03/11/2013, 4:30 PM

## 2013-03-11 NOTE — Progress Notes (Signed)
[facility] Group Notes:  (Nursing/MHT/Case Management/Adjunct)  Date:  03/11/2013  Time:  4:08 PM  Type of Therapy:  Psychoeducational Skills  Participation Level:  Did Not Attend  Participation Quality:  Did not attend  Affect:  Did not attend  Cognitive:  Did not attend  Insight:  None  Engagement in Group:  Did not attend  Modes of Intervention:  Did not attend  Summary of Progress/Problems: The purpose of this group is to identify positive and negative coping skills. Pts were encouraged to give a healthy coping skill they already use and one they would like to try in the future. Pt did not attend.  Caswell Corwin 03/11/2013, 4:08 PM

## 2013-03-11 NOTE — Progress Notes (Signed)
Found pt resting in room where she has been most of evening. Reports feeling lethargic much of today and attributes this to med adjustment. Pleasant, cooperative. No complaints. Supported, encouraged. Offered meds but pt refusing at this time. Pt now asleep. Will offer if pt awakens. Denies SI/HI/AVH and remains safe. Lawrence Marseilles

## 2013-03-11 NOTE — Progress Notes (Signed)
Patient ID: Laurie Chaney, female   DOB: [birth year], 22 y.o.   MRN: [id number] [facility] MD Progress Note  03/11/2013 11:24 AM Laurie Chaney  MRN:  [id number] Subjective:   Patient more open to talking about her problems with writer today. She speaks about feeling safe in the hospital because "good people are here that understand. Many of my friends abused heroine and I had to distance myself. 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 I used to think I did not but the first thing in the morning I have the urge to smoke." She denies feeling depressed but rates anxiety at six. Patient is worried about continuing her drug use and talks about "Overdosing on heroine in 2012. I stopped breathing and my friends were afraid to take me to the hospital." The patient reports having no idea that she had an STD and was encouraged to ask her boyfriend to get tested along with getting herself retested after d/c to make sure treatment was effective.   Diagnosis:   Axis I: Generalized Anxiety Disorder and Substance Induced Mood Disorder Axis II: Borderline Personality Dis. and Cluster B Traits Axis III:  Past Medical History  Diagnosis Date  . Acne   . Migraine   . Anxiety   . Seizures     had 6 none since 4th grade  . Depression     hx of wellbutrin and lexapro use  . History of recurrent UTIs     as a child had evaluation  . History of varicella   . Overdose 07/23/2011  . Respiratory failure, acute 07/23/2011  . Gonorrhea   . Chlamydia 11/2012   Axis IV: economic problems, educational problems, occupational problems, problems related to legal system/crime and problems related to social environment Axis V: 41-50 serious symptoms  ADL's:  Intact  Sleep: Fair  Appetite:  Good  Suicidal Ideation:  Denies Homicidal Ideation:  Denies AEB (as evidenced by):  Psychiatric Specialty Exam: Review of Systems  Constitutional: Negative.   HENT: Negative.   Eyes:  Negative.   Gastrointestinal: Negative.   Genitourinary: Negative.   Musculoskeletal: Negative.   Skin: Negative.   Neurological: Negative.   Endo/Heme/Allergies: Negative.   Psychiatric/Behavioral: Positive for depression and substance abuse. Negative for suicidal ideas, hallucinations and memory loss. The patient is nervous/anxious. The patient does not have insomnia.     Blood pressure 106/71, pulse 81, temperature 97.6 F (36.4 C), temperature source Oral, resp. rate 16, height 4\' 11"  (1.499 m), weight 43.999 kg (97 lb), last menstrual period 02/27/2013, SpO2 99.00%.Body mass index is 19.58 kg/(m^2).  General Appearance: Casual  Eye Contact::  Fair  Speech:  Clear and Coherent  Volume:  Normal  Mood:  Anxious  Affect:  Blunt  Thought Process:  Goal Directed  Orientation:  Full (Time, Place, and Person)  Thought Content:  WDL  Suicidal Thoughts:  No  Homicidal Thoughts:  No  Memory:  Immediate;   Good Recent;   Good Remote;   Good  Judgement:  Impaired  Insight:  Lacking  Psychomotor Activity:  Normal  Concentration:  Fair  Recall:  Good  Akathisia:  No  Handed:  Right  AIMS (if indicated):     Assets:  Communication Skills Desire for Improvement Leisure Time Physical Health Resilience Social Support  Sleep:  Number of Hours: 6.75   Current Medications: Current Facility-Administered Medications  Medication Dose Route Frequency Provider Last Rate Last Dose  . acetaminophen (  TYLENOL) tablet 650 mg  650 mg Oral Q6H PRN Kerry Hough, PA-C      . alum & mag hydroxide-simeth (MAALOX/MYLANTA) 200-200-20 MG/5ML suspension 30 mL  30 mL Oral Q4H PRN Kerry Hough, PA-C      . chlordiazePOXIDE (LIBRIUM) capsule 25 mg  25 mg Oral Q6H PRN Verne Spurr, PA-C   25 mg at 03/09/13 1302  . chlordiazePOXIDE (LIBRIUM) capsule 25 mg  25 mg Oral BH-qamhs Verne Spurr, PA-C       Followed by  . [START ON 03/13/2013] chlordiazePOXIDE (LIBRIUM) capsule 25 mg  25 mg Oral Daily Verne Spurr, PA-C      . hydrOXYzine (ATARAX/VISTARIL) tablet 25 mg  25 mg Oral Q6H PRN Verne Spurr, PA-C   25 mg at 03/09/13 1446  . lamoTRIgine (LAMICTAL) tablet 100 mg  100 mg Oral q morning - 10a Fransisca Kaufmann, NP   100 mg at 03/11/13 0818  . loperamide (IMODIUM) capsule 2-4 mg  2-4 mg Oral PRN Verne Spurr, PA-C      . magnesium hydroxide (MILK OF MAGNESIA) suspension 30 mL  30 mL Oral Daily PRN Kerry Hough, PA-C      . multivitamin with minerals tablet 1 tablet  1 tablet Oral Daily Verne Spurr, PA-C   1 tablet at 03/11/13 0817  . OLANZapine-FLUoxetine (SYMBYAX) 3-25 MG per capsule 1 capsule  1 capsule Oral QPM Verne Spurr, PA-C   1 capsule at 03/10/13 1809  . ondansetron (ZOFRAN-ODT) disintegrating tablet 4 mg  4 mg Oral Q6H PRN Verne Spurr, PA-C      . thiamine (VITAMIN B-1) tablet 100 mg  100 mg Oral Daily Verne Spurr, PA-C   100 mg at 03/11/13 0817  . traZODone (DESYREL) tablet 50 mg  50 mg Oral QHS,MR X 1 Kerry Hough, PA-C   50 mg at 03/09/13 2216    Lab Results: No results found for this or any previous visit (from the past 48 hour(s)).  Physical Findings: AIMS: Facial and Oral Movements Muscles of Facial Expression: None, normal Lips and Perioral Area: None, normal Jaw: None, normal Tongue: None, normal,Extremity Movements Upper (arms, wrists, hands, fingers): None, normal Lower (legs, knees, ankles, toes): None, normal, Trunk Movements Neck, shoulders, hips: None, normal, Overall Severity Severity of abnormal movements (highest score from questions above): None, normal Incapacitation due to abnormal movements: None, normal Patient's awareness of abnormal movements (rate only patient's report): No Awareness, Dental Status Current problems with teeth and/or dentures?: No Does patient usually wear dentures?: No  CIWA:  CIWA-Ar Total: 0 COWS:     Treatment Plan Summary: Daily contact with patient to assess and evaluate symptoms and progress in  treatment Medication management  Plan: Continue crisis management and stabilization.  Medication management: Continue librium detox protocol to assist patient in getting off benzos. Continue home medications as ordered.  Encouraged patient to attend groups and participate in group counseling sessions and activities.  Discharge plan in progress.  Address health issues: Vitals reviewed and stable. Spoke with patient about her report of positive chlamydia test that was was treated yesterday.  Continue current treatment plan.   Medical Decision Making Problem Points:  Established problem, stable/improving (1) and Review of psycho-social stressors (1) Data Points:  Review of medication regiment & side effects (2)  I certify that inpatient services furnished can reasonably be expected to improve the patient's condition.   Tecia Cinnamon NP-C 03/11/2013, 11:24 AM

## 2013-03-12 MED ORDER — ENSURE PUDDING PO PUDG
1.0000 | Freq: Three times a day (TID) | ORAL | Status: DC
  Administered 2013-03-12 – 2013-03-14 (×4): 1 via ORAL
  Filled 2013-03-12 (×12): qty 1

## 2013-03-12 NOTE — Progress Notes (Signed)
Patient ID: Laurie Chaney, female   DOB: 06/30/1991, 22 y.o.   MRN: [id number] [facility] MD Progress Note 03/12/2013 Laurie Chaney  MRN:  [id number] Subjective: I'm better but had some increased anxiety this morning. Diagnosis:  Benzodiazepine dependence   ADL's:  Intact  Sleep: Good  Appetite:  Fair  Suicidal Ideation:  denies Homicidal Ideation:  denies AEB (as evidenced by):patient's report of symptoms  Psychiatric Specialty Exam: Review of Systems  Constitutional: Negative.  Negative for fever, chills, weight loss, malaise/fatigue and diaphoresis.  HENT: Negative for congestion and sore throat.   Eyes: Negative for blurred vision, double vision and photophobia.  Respiratory: Negative for cough, shortness of breath and wheezing.   Cardiovascular: Negative for chest pain, palpitations and PND.  Gastrointestinal: Negative for heartburn, nausea, vomiting, abdominal pain, diarrhea and constipation.  Musculoskeletal: Negative for myalgias, joint pain and falls.  Neurological: Negative for dizziness, tingling, tremors, sensory change, speech change, focal weakness, seizures, loss of consciousness, weakness and headaches.  Endo/Heme/Allergies: Negative for polydipsia. Does not bruise/bleed easily.  Psychiatric/Behavioral: Negative for depression, suicidal ideas, hallucinations, memory loss and substance abuse. The patient is not nervous/anxious and does not have insomnia.     Blood pressure 92/57, pulse 76, temperature 97.6 F (36.4 C), temperature source Oral, resp. rate 16, height 4\' 11"  (1.499 m), weight 43.999 kg (97 lb), last menstrual period 02/27/2013, SpO2 99.00%.Body mass index is 19.58 kg/(m^2).  General Appearance: Disheveled  Eye Contact::  Good  Speech:  Clear and Coherent  Volume:  Normal  Mood:  Anxious and Depressed  Affect:  Congruent  Thought Process:  Goal Directed  Orientation:  Full (Time, Place, and Person)  Thought Content:  WDL  Suicidal Thoughts:  No   Homicidal Thoughts:  No  Memory:  NA  Judgement:  Impaired  Insight:  Lacking  Psychomotor Activity:  Increased  Concentration:  Fair  Recall:  Poor  Akathisia:  No  Handed:  Right  AIMS (if indicated):     Assets:  Communication Skills Desire for Improvement Housing Physical Health Resilience Social Support  Sleep:  Number of Hours: 6.75   Current Medications: Current Facility-Administered Medications  Medication Dose Route Frequency Provider Last Rate Last Dose  . acetaminophen (TYLENOL) tablet 650 mg  650 mg Oral Q6H PRN Kerry Hough, PA-C      . alum & mag hydroxide-simeth (MAALOX/MYLANTA) 200-200-20 MG/5ML suspension 30 mL  30 mL Oral Q4H PRN Kerry Hough, PA-C      . chlordiazePOXIDE (LIBRIUM) capsule 25 mg  25 mg Oral Q6H PRN Verne Spurr, PA-C   25 mg at 03/09/13 1302  . chlordiazePOXIDE (LIBRIUM) capsule 25 mg  25 mg Oral TID Verne Spurr, PA-C   25 mg at 03/10/13 1303   Followed by  . [START ON 03/11/2013] chlordiazePOXIDE (LIBRIUM) capsule 25 mg  25 mg Oral BH-qamhs Verne Spurr, PA-C       Followed by  . [START ON 03/13/2013] chlordiazePOXIDE (LIBRIUM) capsule 25 mg  25 mg Oral Daily Verne Spurr, PA-C      . hydrOXYzine (ATARAX/VISTARIL) tablet 25 mg  25 mg Oral Q6H PRN Verne Spurr, PA-C   25 mg at 03/09/13 1446  . lamoTRIgine (LAMICTAL) tablet 100 mg  100 mg Oral q morning - 10a Fransisca Kaufmann, NP   100 mg at 03/10/13 0806  . loperamide (IMODIUM) capsule 2-4 mg  2-4 mg Oral PRN Verne Spurr, PA-C      . magnesium hydroxide (MILK OF MAGNESIA) suspension 30  mL  30 mL Oral Daily PRN Kerry Hough, PA-C      . multivitamin with minerals tablet 1 tablet  1 tablet Oral Daily Verne Spurr, PA-C   1 tablet at 03/10/13 0805  . OLANZapine-FLUoxetine (SYMBYAX) 3-25 MG per capsule 1 capsule  1 capsule Oral QPM Verne Spurr, PA-C   1 capsule at 03/09/13 1850  . ondansetron (ZOFRAN-ODT) disintegrating tablet 4 mg  4 mg Oral Q6H PRN Verne Spurr, PA-C      . thiamine  (VITAMIN B-1) tablet 100 mg  100 mg Oral Daily Verne Spurr, PA-C   100 mg at 03/10/13 0805  . traZODone (DESYREL) tablet 50 mg  50 mg Oral QHS,MR X 1 Kerry Hough, PA-C   50 mg at 03/09/13 2216    Lab Results: No results found for this or any previous visit (from the past 48 hour(s)).  Physical Findings: AIMS: Facial and Oral Movements Muscles of Facial Expression: None, normal Lips and Perioral Area: None, normal Jaw: None, normal Tongue: None, normal,Extremity Movements Upper (arms, wrists, hands, fingers): None, normal Lower (legs, knees, ankles, toes): None, normal, Trunk Movements Neck, shoulders, hips: None, normal, Overall Severity Severity of abnormal movements (highest score from questions above): None, normal Incapacitation due to abnormal movements: None, normal Patient's awareness of abnormal movements (rate only patient's report): No Awareness, Dental Status Current problems with teeth and/or dentures?: No Does patient usually wear dentures?: No  CIWA:  CIWA-Ar Total: 1 COWS:     Treatment Plan Summary: Daily contact with patient to assess and evaluate symptoms and progress in treatment Medication management  Plan: 1. Continue crisis management and stabilization. 2. Medication management to reduce current symptoms to base line and improve patient's overall level of functioning 3. Treat health problems as indicated. 4. Develop treatment plan to decrease risk of relapse upon discharge and the need for  readmission. 5. Psycho-social education regarding relapse prevention and self care. 6. Health care follow up as needed for medical problems. 7. Continue home medications where appropriate. 8. Will continue detox protocol as planned. 9. Continued to offer therapeutic support and encouragement.   Medical Decision Making Problem Points:  Established problem, stable/improving (1) and Review of psycho-social stressors (1) Data Points:  Review or order medicine tests  (1) Review of medication regiment & side effects (2)  I certify that inpatient services furnished can reasonably be expected to improve the patient's condition.  Rona Ravens. Laurie Chaney RPAC 1:20 PM 03/12/2013

## 2013-03-12 NOTE — [facility] Group Notes (Signed)
[facility] Group Notes:  (Clinical Social Work)  03/12/2013   3:00-4:00PM  Summary of Progress/Problems:   The main focus of today's process group was to   identify the patient's current support system and decide on other supports that can be put in place.  The picture on workbook was used to discuss why additional supports are needed, and a hand-out was distributed with four definitions/levels of support, then used to talk about how patients have given and received all different kinds of support.  An emphasis was placed on using counselor, doctor, therapy groups, 12-step groups, and problem-specific support groups to expand supports.  The patient contributed often to the discussion, and talked about the need for a support group that has people with the same type of issues as herself, depression and anxiety.  She is afraid she will end up in a mixed group that includes people with other issues, such as schizophrenia, and said she has nothing against those people, but wants to focus on her own issues.  Group and LCSW were able to tell her about various groups that may be available, and advised that she talk to her social worker and/or doctor in her county about what is available locally.  Type of Therapy:  Process Group  Participation Level:  Active  Participation Quality:  Attentive, Sharing and Supportive  Affect:  Anxious, Blunted and Depressed  Cognitive:  Appropriate and Oriented  Insight:  Engaged  Engagement in Therapy:  Engaged  Modes of Intervention:  Education,  Support and [employer], LCSW 03/12/2013, 4:25 PM

## 2013-03-12 NOTE — Progress Notes (Signed)
[name] says she is " doing better" today. She remaisn tearful, sad and depressed. She attends her groups, is engaged in the group discussion and is actively trying to understand he rdisease.   A She completes her AM self inventory and on it she writes she denies SI within the past 24 hrs, she rates her depression and hopelessness "0/0" and she states her DC plan is to " have and stick to a routin".   R Safety maintiend and POC cont.

## 2013-03-12 NOTE — Progress Notes (Signed)
Psychoeducational Group Note  Date:  03/12/2013 Time: 1015 Group Topic/Focus:  Making Healthy Choices:   The focus of this group is to help patients identify negative/unhealthy choices they were using prior to admission and identify positive/healthier coping strategies to replace them upon discharge.  Participation Level:  Active  Participation Quality:  Appropriate  Affect:  Appropriate  Cognitive:  Appropriate  Insight:  Engaged  Engagement in Group:  Engaged  Additional Comments:    Rich Brave 6:00 PM. 03/12/2013

## 2013-03-12 NOTE — Progress Notes (Signed)
Psychoeducational Group Note  Date:  03/12/2013 Time: 0930 Group Topic/Focus:  Identifying  Positives :  This group focuses on helping patients identify positive things that have impacted their lives.  Participation Level:  active Participation Quality: good Affect: flat Cognitive:    Insight:  good  Engagement in Group: engaged  Additional Comments: [employer] RN [employer]

## 2013-03-12 NOTE — Progress Notes (Signed)
Pt is up this evening and reports feeling much improved from yesterday. Energy is better though appetite remains poor. Pt acknowledges she has not eaten lunch or dinner today though is attempting small snacks. Did take Ensure pudding that was offered. Fluids encouraged, support given. Denies pain or problems. No further complaints. No SI/HI/AVH and remains safe. Lawrence Marseilles

## 2013-03-12 NOTE — Progress Notes (Signed)
[facility] Group Notes:  (Nursing/MHT/Case Management/Adjunct)  Date:  03/12/2013  Time:  6:56 PM  Type of Therapy:  Psychoeducational Skills  Participation Level:  Active  Participation Quality:  Appropriate and Supportive  Affect:  Appropriate  Cognitive:  Appropriate  Insight:  Good  Engagement in Group:  Engaged and Supportive  Modes of Intervention:  Clarification, Discussion and Socialization  Summary of Progress/Problems: The purpose of this group was to discuss healthy supports that patients utilize when discharged and support they would like to build. Pt stated that her main support is her "kitty" Pt stated she also wanted to work on building more healthy support systems

## 2013-03-13 NOTE — Progress Notes (Signed)
Adult Psychoeducational Group Note  Date:  03/13/2013 Time:  11:59 PM  Group Topic/Focus:  Wrap-Up Group:   The focus of this group is to help patients review their daily goal of treatment and discuss progress on daily workbooks.  Participation Level:  Active  Participation Quality:  Appropriate  Affect:  Appropriate  Cognitive:  Appropriate  Insight: Appropriate  Engagement in Group:  Engaged  Modes of Intervention:  Discussion  Additional Comments:  Patient volunteered to share her day with the group first. Patient stated that her morning started off bad due to having a panic attack and attending the grief and loss group with the case manager. Patient shared that she overcame these challenges by seeking help from a nurse, doing breathing exercises, and walking up and down the halls. Patient stated that during her stay here, she has learned about "myself, my worth, coping, and negative and positive self talk."   Lyndee Hensen 03/13/2013, 11:59 PM

## 2013-03-13 NOTE — Tx Team (Signed)
Interdisciplinary Treatment Plan Update (Adult)  Date: 03/13/2013  Time Reviewed:  9:45 AM  Progress in Treatment: Attending groups: Yes Participating in groups:  Yes Taking medication as prescribed:  Yes Tolerating medication:  Yes Family/Significant othe contact made: Yes Patient understands diagnosis:  Yes Discussing patient identified problems/goals with staff:  Yes Medical problems stabilized or resolved:  Yes Denies suicidal/homicidal ideation: Yes Issues/concerns per patient self-inventory:  Yes Other:  New problem(s) identified: N/A  Discharge Plan or Barriers: Pt has follow up scheduled at [facility] for medication management.  CSW will refer pt to [facility] in [place], [place] for therapy/support groups.    Reason for Continuation of Hospitalization: Anxiety Depression Medication Stabilization  Comments: N/A  Estimated length of stay: 1 day, d/c tomorrow  For review of initial/current patient goals, please see plan of care.  Attendees: Patient:     Family:     Physician:  03/13/2013 10:12 AM   Nursing:   Dellia Cloud, RN 03/13/2013 10:12 AM   Clinical Social Worker:  Reyes Ivan, LCSWA 03/13/2013 10:12 AM   Other: Verne Spurr, PA 03/13/2013 10:12 AM   Other:  Frankey Shown, MA care coordination 03/13/2013 10:12 AM   Other:  Juline Patch, LCSW 03/13/2013 10:12 AM   Other:  Onnie Boer, Case manager  03/13/2013 10:13 AM   Other: Quintella Reichert, RN 03/13/2013 10:13 AM   Other: Neill Loft, RN   Other:    Other:    Other:    Other:     Scribe for Treatment Team:   Carmina Miller, 03/13/2013 10:12 AM

## 2013-03-13 NOTE — [facility] Group Notes (Signed)
[facility] LCSW Aftercare Discharge Planning Group Note   03/13/2013   8:45 AM  Participation Quality:  Alert and Appropriate   Mood/Affect:  Appropriate and Anxious  Depression Rating:  2  Anxiety Rating: 8  Thoughts of Suicide:  Pt denies SI/HI  Will you contract for safety?   Yes  Current AVH:  Pt denies  Plan for Discharge/Comments:  Pt attended discharge planning group and actively participated in group.  CSW provided pt with today's workbook.  Pt reports high anxiety today because she is afraid to d/c and not have as much support at home.  Pt states that she will return to [facility] upon d/c.  Pt has follow up scheduled with [facility] and [facility] for medication management and therapy.  No further needs voiced by pt at this time.    Transportation Means: Pt reports access to transportation  Supports: Pt states that her family is supportive  Reyes Ivan, LCSWA /03/13/2013 10:34 AM

## 2013-03-13 NOTE — Progress Notes (Signed)
Recreation Therapy Notes  Date: 07.21.2014   Time: 3:00pm Location: [address] Dayroom     Group Topic/Focus: Self Esteem  Participation Level: Active  Participation Quality: Appropriate   Affect: Euthymic  Cognitive: Appropriate  Group discussion: Affect of self-esteem on whole wellness.   Patient participation: Active  Additional Comments: Activity: Teacher, adult education; Explanation: Patients were asked to select as many toilet paper squares as they will need until after dinner this evening. For each square selected patient was asked to state a positive quality about themselves.   Patient actively participated in activity and contributed to group discussion. Patient successfully stated positive qualities about herself. Patient positive self statements included being good at yoga and "one with nature." Patient shared that having good self-esteem can assist with keeping a balanced lifestyle.   Marykay Lex Kenden Brandt, LRT/CTRS  Jearl Klinefelter 03/13/2013 4:29 PM

## 2013-03-13 NOTE — Progress Notes (Signed)
Patient ID: Laurie Chaney, female   DOB: [birth year], 22 y.o.   MRN: [id number] D- Patient reports poor sleep last night and good appetite.  Her energy level is normal and her ability to pay attention is good.  She denies feeling depressed and this am was a talking about feeling ready to be discharged.  After discharge planning group patient became very anxious and "I feel like I'm going to have a panic attack"  Pt requested med for anxiety.  Talked with patient about getting anxious about thinkijg about going home and how she has no friends who don't use and her mother is out of town and father is working.  Pt was tearful and beginning to hyper ventilate.  Walked withpatient in the hall and reviewed her coping skills.  Gave pt assignments to do and talked with her about using opportunity to practice coping skills instead of take a pill.  Patient was able to calm herself.  She is making a safety and activity plan for tomorrow which is now day of scheduled  D/C

## 2013-03-13 NOTE — Progress Notes (Signed)
Patient ID: CORTINA VULTAGGIO, female   DOB: 15-Nov-1990, 22 y.o.   MRN: [id number] [facility] MD Progress Note 03/13/2013 BRITANNI YARDE  MRN:  [id number] Subjective: Patient was scheduled for discharge this AM however, due to increased anxiety, she will stay today and discharge in the AM.   Suicidal Ideation:  denies Homicidal Ideation:  denies AEB (as evidenced by):patient's report of symptoms  Psychiatric Specialty Exam: Review of Systems  Constitutional: Negative.  Negative for fever, chills, weight loss, malaise/fatigue and diaphoresis.  HENT: Negative for congestion and sore throat.   Eyes: Negative for blurred vision, double vision and photophobia.  Respiratory: Negative for cough, shortness of breath and wheezing.   Cardiovascular: Negative for chest pain, palpitations and PND.  Gastrointestinal: Negative for heartburn, nausea, vomiting, abdominal pain, diarrhea and constipation.  Musculoskeletal: Negative for myalgias, joint pain and falls.  Neurological: Negative for dizziness, tingling, tremors, sensory change, speech change, focal weakness, seizures, loss of consciousness, weakness and headaches.  Endo/Heme/Allergies: Negative for polydipsia. Does not bruise/bleed easily.  Psychiatric/Behavioral: Negative for depression, suicidal ideas, hallucinations, memory loss and substance abuse. The patient is not nervous/anxious and does not have insomnia.     Blood pressure 92/57, pulse 76, temperature 97.6 F (36.4 C), temperature source Oral, resp. rate 16, height 4\' 11"  (1.499 m), weight 43.999 kg (97 lb), last menstrual period 02/27/2013, SpO2 99.00%.Body mass index is 19.58 kg/(m^2).  General Appearance: Disheveled  Eye Contact::  Good  Speech:  Clear and Coherent  Volume:  Normal  Mood:  Anxious and Depressed  Affect:  Congruent  Thought Process:  Goal Directed  Orientation:  Full (Time, Place, and Person)  Thought Content:  WDL  Suicidal Thoughts:  No  Homicidal Thoughts:  No   Memory:  NA  Judgement:  Impaired  Insight:  Lacking  Psychomotor Activity:  Increased  Concentration:  Fair  Recall:  Poor  Akathisia:  No  Handed:  Right  AIMS (if indicated):     Assets:  Communication Skills Desire for Improvement Housing Physical Health Resilience Social Support  Sleep:  Number of Hours: 6.75   Current Medications: Current Facility-Administered Medications  Medication Dose Route Frequency Provider Last Rate Last Dose  . acetaminophen (TYLENOL) tablet 650 mg  650 mg Oral Q6H PRN Kerry Hough, PA-C      . alum & mag hydroxide-simeth (MAALOX/MYLANTA) 200-200-20 MG/5ML suspension 30 mL  30 mL Oral Q4H PRN Kerry Hough, PA-C      . chlordiazePOXIDE (LIBRIUM) capsule 25 mg  25 mg Oral Q6H PRN Verne Spurr, PA-C   25 mg at 03/09/13 1302  . chlordiazePOXIDE (LIBRIUM) capsule 25 mg  25 mg Oral TID Verne Spurr, PA-C   25 mg at 03/10/13 1303   Followed by  . [START ON 03/11/2013] chlordiazePOXIDE (LIBRIUM) capsule 25 mg  25 mg Oral BH-qamhs Verne Spurr, PA-C       Followed by  . [START ON 03/13/2013] chlordiazePOXIDE (LIBRIUM) capsule 25 mg  25 mg Oral Daily Verne Spurr, PA-C      . hydrOXYzine (ATARAX/VISTARIL) tablet 25 mg  25 mg Oral Q6H PRN Verne Spurr, PA-C   25 mg at 03/09/13 1446  . lamoTRIgine (LAMICTAL) tablet 100 mg  100 mg Oral q morning - 10a Fransisca Kaufmann, NP   100 mg at 03/10/13 0806  . loperamide (IMODIUM) capsule 2-4 mg  2-4 mg Oral PRN Verne Spurr, PA-C      . magnesium hydroxide (MILK OF MAGNESIA) suspension 30 mL  30  mL Oral Daily PRN Kerry Hough, PA-C      . multivitamin with minerals tablet 1 tablet  1 tablet Oral Daily Verne Spurr, PA-C   1 tablet at 03/10/13 0805  . OLANZapine-FLUoxetine (SYMBYAX) 3-25 MG per capsule 1 capsule  1 capsule Oral QPM Verne Spurr, PA-C   1 capsule at 03/09/13 1850  . ondansetron (ZOFRAN-ODT) disintegrating tablet 4 mg  4 mg Oral Q6H PRN Verne Spurr, PA-C      . thiamine (VITAMIN B-1) tablet 100  mg  100 mg Oral Daily Verne Spurr, PA-C   100 mg at 03/10/13 0805  . traZODone (DESYREL) tablet 50 mg  50 mg Oral QHS,MR X 1 Kerry Hough, PA-C   50 mg at 03/09/13 2216    Lab Results: No results found for this or any previous visit (from the past 48 hour(s)).  Physical Findings: AIMS: Facial and Oral Movements Muscles of Facial Expression: None, normal Lips and Perioral Area: None, normal Jaw: None, normal Tongue: None, normal,Extremity Movements Upper (arms, wrists, hands, fingers): None, normal Lower (legs, knees, ankles, toes): None, normal, Trunk Movements Neck, shoulders, hips: None, normal, Overall Severity Severity of abnormal movements (highest score from questions above): None, normal Incapacitation due to abnormal movements: None, normal Patient's awareness of abnormal movements (rate only patient's report): No Awareness, Dental Status Current problems with teeth and/or dentures?: No Does patient usually wear dentures?: No  CIWA:  CIWA-Ar Total: 1 COWS:     Treatment Plan Summary: Daily contact with patient to assess and evaluate symptoms and progress in treatment Medication management  Plan: 1. Continue crisis management and stabilization. 2. Medication management to reduce current symptoms to base line and improve patient's overall level of functioning 3. Treat health problems as indicated. 4. Develop treatment plan to decrease risk of relapse upon discharge and the need for  readmission. 5. Psycho-social education regarding relapse prevention and self care. 6. Health care follow up as needed for medical problems. 7. Continue home medications where appropriate. 8. Will continue to  offer therapeutic support and encouragement. 9. Anticipate D/C home on Tomorrow if anxiety and panic is decreased.  Medical Decision Making Problem Points:  Established problem, stable/improving (1) and Review of psycho-social stressors (1) Data Points:  Review or order medicine  tests (1) Review of medication regiment & side effects (2)  I certify that inpatient services furnished can reasonably be expected to improve the patient's condition.  Rona Ravens. Jaidynn Balster RPAC 1:20 PM 03/13/2013

## 2013-03-13 NOTE — [facility] Group Notes (Signed)
[facility] LCSW Group Therapy  03/13/2013  1:15 PM   Type of Therapy:  Group Therapy  Participation Level:  Active  Participation Quality:  Appropriate and Attentive  Affect:  Appropriate and Anxious  Cognitive:  Alert and Appropriate  Insight:  Developing/Improving and Engaged  Engagement in Therapy:  Developing/Improving and Engaged  Modes of Intervention:  Clarification, Confrontation, Discussion, Education, Exploration, Limit-setting, Orientation, Problem-solving, Rapport Building, Dance movement psychotherapist, Socialization and Support  Summary of Progress/Problems: Pt identified obstacles faced currently and processed barriers involved in overcoming these obstacles. Pt identified steps necessary for overcoming these obstacles and explored motivation (internal and external) for facing these difficulties head on. Pt further identified one area of concern in their lives and chose a goal to focus on for today.  Pt shared that her biggest obstacle is herself.  Pt states that she often gives up and feels like a failure in life.  Pt was able to process past experiences in her childhood that contribute to her low self esteem today, such as the school she went to and being compared to her older sister.  Pt states that she doesn't know where to start when working on herself.  CSW encouraged pt to focus on one small part of herself, such as improving her self esteem.  Pt received positive feedback from peers on ways to improve self esteem. Pt actively participated and was engaged in group discussion.    Reyes Ivan, LCSWA 03/13/2013 2:21 PM

## 2013-03-13 NOTE — Progress Notes (Signed)
Adult Psychoeducational Group Note  Date:  03/13/2013 Time:  1:51 PM  Group Topic/Focus:  Dimensions of Wellness:   The focus of this group is to introduce the topic of wellness and discuss the role each dimension of wellness plays in total health.  Participation Level:  Did Not Attend   Laurie Chaney 03/13/2013, 1:51 PM

## 2013-03-13 NOTE — Progress Notes (Signed)
Grief and Loss Group  Group members discussed their experiences with grief and loss and the feelings they had surrounding those experiences. To facilitate this discussion, group members choose one of a series of displayed photographs that represented their experiences of grief and loss and then each member shared their photo and why they choose it during the group.  Pt was present and attentive throughout the entire group. She did not respond verbally, but began crying at numerous points during the group while listening to the stories of other group members.   Lenoard Aden Counseling Intern Haroldine Laws

## 2013-03-14 DIAGNOSIS — F1910 Other psychoactive substance abuse, uncomplicated: Secondary | ICD-10-CM

## 2013-03-14 DIAGNOSIS — F332 Major depressive disorder, recurrent severe without psychotic features: Principal | ICD-10-CM

## 2013-03-14 MED ORDER — LAMOTRIGINE 100 MG PO TABS
100.0000 mg | ORAL_TABLET | Freq: Every morning | ORAL | Status: AC
Start: 2013-03-14 — End: ?

## 2013-03-14 MED ORDER — LAMOTRIGINE 100 MG PO TABS: 100.0000 mg | ORAL_TABLET | Freq: Every morning | ORAL | Status: DC

## 2013-03-14 MED ORDER — THIAMINE HCL 100 MG PO TABS: 100.0000 mg | ORAL_TABLET | Freq: Every day | ORAL | Status: AC

## 2013-03-14 MED ORDER — TRAZODONE HCL 50 MG PO TABS: 50.0000 mg | ORAL_TABLET | Freq: Every evening | ORAL | Status: AC | PRN

## 2013-03-14 MED ORDER — OLANZAPINE-FLUOXETINE HCL 3-25 MG PO CAPS: 1.0000 | ORAL_CAPSULE | Freq: Every evening | ORAL | Status: AC

## 2013-03-14 NOTE — Progress Notes (Signed)
[facility] Adult Case Management Discharge Plan :  Will you be returning to the same living situation after discharge: Yes,  returning home At discharge, do you have transportation home?:Yes,  father will pick pt up Do you have the ability to pay for your medications:Yes,  access to meds  Release of information consent forms completed and in the chart;  Patient's signature needed at discharge.  Patient to Follow up at: Follow-up Information   Follow up with [facility] On 03/16/2013. (Appointment scheduled at 2:15 pm with Dr. Evelene Croon for medication management)    Contact information:   [address], [address] [place], [place] [address] Phone: [phone] Fax: [phone]      Follow up with [facility] On 03/21/2013. (Appointment scheduled at 1:00 pm with Lyndel Pleasure for intake assessment for therapy.  Bring ID with local address and insurance card.)    Contact information:   [address] [place], [place], [place] [address] Phone: [phone] Fax: [phone]      Patient denies SI/HI:   Yes,  denies SI/HI    Safety Planning and Suicide Prevention discussed:  Yes,  discussed with pt and pt's mother.  See suicide prevention note.   Laurie Chaney 03/14/2013, 10:46 AM

## 2013-03-14 NOTE — Progress Notes (Signed)
D: Stated that "yesterday and this morning was rough because she knows she's leaving". Stated, "she made a lot of progress here and doesn't want to lose it". Pt stated she plans to her parents, then shortly after go live her with aunt in [place]. Stated she plans to attend OP Monday following d/c.  A:  Support and encouragement was offered. 15 min checks continued for safety.  R: Pt remains safe.

## 2013-03-14 NOTE — Progress Notes (Signed)
Patient ID: Laurie Chaney, female   DOB: [birth year], 22 y.o.   MRN: [id number] Patient is discharged ambulatory to ride home with her father.  She is then planning to go to live in [place] with her mother and then her aunt.  She denies SI/HI. She verbalizes understanding of her discharge meds and followup. She was given scripts for her meds.  She has been practicing her coping skills and focusing on forgiveness and gratitude. She is hopeful for the future.

## 2013-03-14 NOTE — [facility] Suicide Risk Assessment (Signed)
Suicide Risk Assessment  Discharge Assessment     Demographic Factors:  Caucasian  Mental Status Per Nursing Assessment::   On Admission:     Current Mental Status by Physician: In full contact with reality. There are no suicidal ideas, plans or intent. Her mood is euthymic, her affect is appropriate. There are no suicidal ideas, plans or intent. She is planning to go back to [employer], back to her job. Plans to go back to school during Spring. She is going to be back with her therapist as seek out a new psychiatrist to help with more conservative medications choices. She state she learned that when she comes back to [facility] she is going to avoid certain places that trigger her symptoms.   Loss Factors: NA  Historical Factors: NA  Risk Reduction Factors:   Sense of responsibility to family, Employed, Living with another person, especially a relative, Positive social support and Positive therapeutic relationship  Continued Clinical Symptoms:  Severe Anxiety and/or Agitation Depression:   Comorbid alcohol abuse/dependence Alcohol/Substance Abuse/Dependencies  Cognitive Features That Contribute To Risk: None identified   Suicide Risk:  Minimal: No identifiable suicidal ideation.  Patients presenting with no risk factors but with morbid ruminations; may be classified as minimal risk based on the severity of the depressive symptoms  Discharge Diagnoses:   AXIS I:  Major Depression, Anxiety Disorder NOS, Cannabis Abuse, Benzodiazepine Dependence AXIS II:  Deferred AXIS III:   Past Medical History  Diagnosis Date  . Acne   . Migraine   . Anxiety   . Seizures     had 6 none since 4th grade  . Depression     hx of wellbutrin and lexapro use  . History of recurrent UTIs     as a child had evaluation  . History of varicella   . Overdose 07/23/2011  . Respiratory failure, acute 07/23/2011  . Gonorrhea   . Chlamydia 11/2012   AXIS IV:  other psychosocial or  environmental problems AXIS V:  61-70 mild symptoms  Plan Of Care/Follow-up recommendations:  Activity:  as tolerated Diet:  regular Follow up outpatient basis Is patient on multiple antipsychotic therapies at discharge:  No   Has Patient had three or more failed trials of antipsychotic monotherapy by history:  No  Recommended Plan for Multiple Antipsychotic Therapies: N/A   Elsbeth Yearick A 03/14/2013, 11:51 AM

## 2013-03-14 NOTE — Discharge Summary (Signed)
Physician Discharge Summary Note  Patient:  Laurie Chaney is an 22 y.o., female MRN:  [id number] DOB:  1990/12/12 Patient phone:  [phone] (home)  Patient address:   [address] [place] [address],   Date of Admission:  03/08/2013 Date of Discharge: 03/14/2013  Reason for Admission:    Discharge Diagnoses: Principal Problem:   Benzodiazepine dependence Active Problems:   ANXIETY  Review of Systems  Constitutional: Negative.   HENT: Negative.   Eyes: Negative.   Respiratory: Negative.   Cardiovascular: Negative.   Gastrointestinal: Negative.   Genitourinary: Negative.   Musculoskeletal: Negative.   Skin: Negative.   Neurological: Negative.   Endo/Heme/Allergies: Negative.   Psychiatric/Behavioral: Positive for depression and substance abuse. The patient is nervous/anxious.    Axis Diagnosis:   AXIS I:  Major Depression, Recurrent severe and Substance Abuse AXIS II:  Deferred AXIS III:   Past Medical History  Diagnosis Date  . Acne   . Migraine   . Anxiety   . Seizures     had 6 none since 4th grade  . Depression     hx of wellbutrin and lexapro use  . History of recurrent UTIs     as a child had evaluation  . History of varicella   . Overdose 07/23/2011  . Respiratory failure, acute 07/23/2011  . Gonorrhea   . Chlamydia 11/2012   AXIS IV:  other psychosocial or environmental problems, problems related to social environment and problems with primary support group AXIS V:  61-70 mild symptoms  Level of Care:  OP  Hospital Course:  On admission:  involuntary admission for the next [name] is a 22 year old single white female who arrived in the emergency room via ambulance yesterday reporting suicidal ideation. The patient is currently living in [facility] and recently came back [place] for a visit about 10 days ago. 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 Patient notes a long history of substance abuse starting with marijuana at age 72, and culminating in an accidental overdose on heroin 18 months ago. The patient required ventilator support for several days, after which she was discharged to participate in the [facility] IOP program at [facility].   The patient notes that seen all of her old hangout spots history great deal of flashback and anxiety for her stating that she's lost multiple friends all due to substance abuse in the past. She states she became overwhelmed and suffered a great deal of panic and did not feel safe. She reports multiple suicide attempts since the sixth grade including an overdose on Ambien and attempting to jump off of a roof cutting as well. She states her last episode of cutting was approximately 6 months ago.  Patient reports that she is living in [place] and planning to go to massage therapy school in October but is currently unemployed. She is living with her aunt in a basement apartment in [place] trying to find a job. She states she will be working at [employer] when she returns to [place]. She denies relapsing on opiates but says she drinks alcohol occasionally and smokes marijuana every now and then. Patient is followed by Dr. Evelene Croon who currently has her on Klonopin 1 mg 4 times a day, and Symbiax as well as Lamictal. She also reports that she has seizures upon withdrawal from benzos from stopping abruptly.  During  hospitalization:  Medications managed--Lamictal 100 mg daily for mood disorder, Symbyax 3-25 every pm for her depression continued.  Her clonazepam was tapered to discontinuation.  Trazodone 50 mg, repeat x 1, for sleep and thiamine B-1  100 mg for vitamin B deficiency.  Antibiotics continued until completion for her UTI.  [name] attended groups with participation.  Patient denied suicidal/homicidal ideations and auditory/visual hallucinations, follow-up  appointments encouraged to attend in [place] (appointment in place), outside support groups encouraged and information given, Rx given.  [name] is mentally and physically stable for discharge.  Consults:  None  Significant Diagnostic Studies:  labs: Completed in ED, reviewed, stable  Discharge Vitals:   Blood pressure 116/84, pulse 74, temperature 97.5 F (36.4 C), temperature source Oral, resp. rate 16, height 4\' 11"  (1.499 m), weight 43.999 kg (97 lb), last menstrual period 02/27/2013, SpO2 99.00%. Body mass index is 19.58 kg/(m^2). Lab Results:   No results found for this or any previous visit (from the past 72 hour(s)).  Physical Findings: AIMS: Facial and Oral Movements Muscles of Facial Expression: None, normal Lips and Perioral Area: None, normal Jaw: None, normal Tongue: None, normal,Extremity Movements Upper (arms, wrists, hands, fingers): None, normal Lower (legs, knees, ankles, toes): None, normal, Trunk Movements Neck, shoulders, hips: None, normal, Overall Severity Severity of abnormal movements (highest score from questions above): None, normal Incapacitation due to abnormal movements: None, normal Patient's awareness of abnormal movements (rate only patient's report): No Awareness, Dental Status Current problems with teeth and/or dentures?: No Does patient usually wear dentures?: No  CIWA:  CIWA-Ar Total: 0 COWS:     Psychiatric Specialty Exam: See Psychiatric Specialty Exam and Suicide Risk Assessment completed by Attending Physician prior to discharge.  Discharge destination:  Home  Is patient on multiple antipsychotic therapies at discharge:  No   Has Patient had three or more failed trials of antipsychotic monotherapy by history:  No Recommended Plan for Multiple Antipsychotic Therapies:  N/A  Discharge Orders   Future Orders Complete By Expires     Activity as tolerated - No restrictions  As directed     Diet - low sodium heart healthy  As directed          Medication List    STOP taking these medications       cephALEXin 250 MG capsule  Commonly known as:  KEFLEX     clonazePAM 1 MG tablet  Commonly known as:  KLONOPIN     metroNIDAZOLE 500 MG tablet  Commonly known as:  FLAGYL      TAKE these medications     Indication   lamoTRIgine 100 MG tablet  Commonly known as:  LAMICTAL  Take 1 tablet (100 mg total) by mouth every morning.   Indication:  mood disorder     OLANZapine-FLUoxetine 3-25 MG per capsule  Commonly known as:  SYMBYAX  Take 1 capsule by mouth every evening.   Indication:  Major Depressive Disorder     thiamine 100 MG tablet  Take 1 tablet (100 mg total) by mouth daily.   Indication:  Deficiency in Thiamine or Vitamin B1     traZODone 50 MG tablet  Commonly known as:  DESYREL  Take 1 tablet (50 mg total) by mouth at bedtime and may repeat dose one time if needed.   Indication:  Trouble Sleeping           Follow-up Information   Follow up with [facility] On 03/16/2013. (Appointment scheduled at 2:15 pm with  Dr. Evelene Croon for medication management)    Contact information:   [address], [address] [place], [place] [address] Phone: [phone] Fax: [phone]      Follow up with [facility] On 03/21/2013. (Appointment scheduled at 1:00 pm with Lyndel Pleasure for intake assessment for therapy.  Bring ID with local address and insurance card.)    Contact information:   [address] [place], [place], [place] [address] Phone: [phone] Fax: [phone]      Follow-up recommendations:  Activity:  As tolerated Diet:  Low-sodium heart healthy diet Continue to work your relapse prevention plan Comments:  Patient will continue her care at [facility] in [place], [place] where she resides.  Total Discharge Time:  Greater than 30 minutes.  SignedNanine Means, PMH-NP 03/14/2013, 8:57 AM Agree with assessment and plan Reymundo Poll. Dub Mikes, M.D.

## 2013-03-15 NOTE — Progress Notes (Signed)
Agree with assessment and plan Rachael Fee, M.D.

## 2013-03-15 NOTE — Progress Notes (Signed)
Agree with assessment and plan Nalayah Hitt A. Ellieana Dolecki, M.D. 

## 2013-03-17 NOTE — Progress Notes (Signed)
Patient Discharge Instructions:  After Visit Summary (AVS):   Faxed to:  03/17/13 Discharge Summary Note:   Faxed to:  03/17/13 Psychiatric Admission Assessment Note:   Faxed to:  03/17/13 Suicide Risk Assessment - Discharge Assessment:   Faxed to:  03/17/13 Faxed/Sent to the Next Level Care provider:  03/17/13 Faxed to [facility] @ [phone] Faxed to [facility] @ [phone]  Jerelene Redden, 03/17/2013, 3:19 PM

## 2013-03-20 NOTE — Consult Note (Signed)
Reviewed the information documented and agree with the treatment plan.  Lanique Gonzalo,JANARDHAHA R. 03/20/2013 6:33 PM

## 2013-03-22 NOTE — ED Provider Notes (Signed)
Medical screening examination/treatment/procedure(s) were performed by non-physician practitioner and as supervising physician I was immediately available for consultation/collaboration.  Yanette Tripoli R. Achol Azpeitia, MD 03/22/13 2043 

## 2013-09-05 IMAGING — DX DG CHEST 1V PORT
1 series · 1 of 1 positions shown · non-contrast
Comparison: Portable exam 9980 hours without priors for comparison.

CLINICAL DATA: Altered mental status, intubation

PORTABLE CHEST - 1 VIEW

[AP]
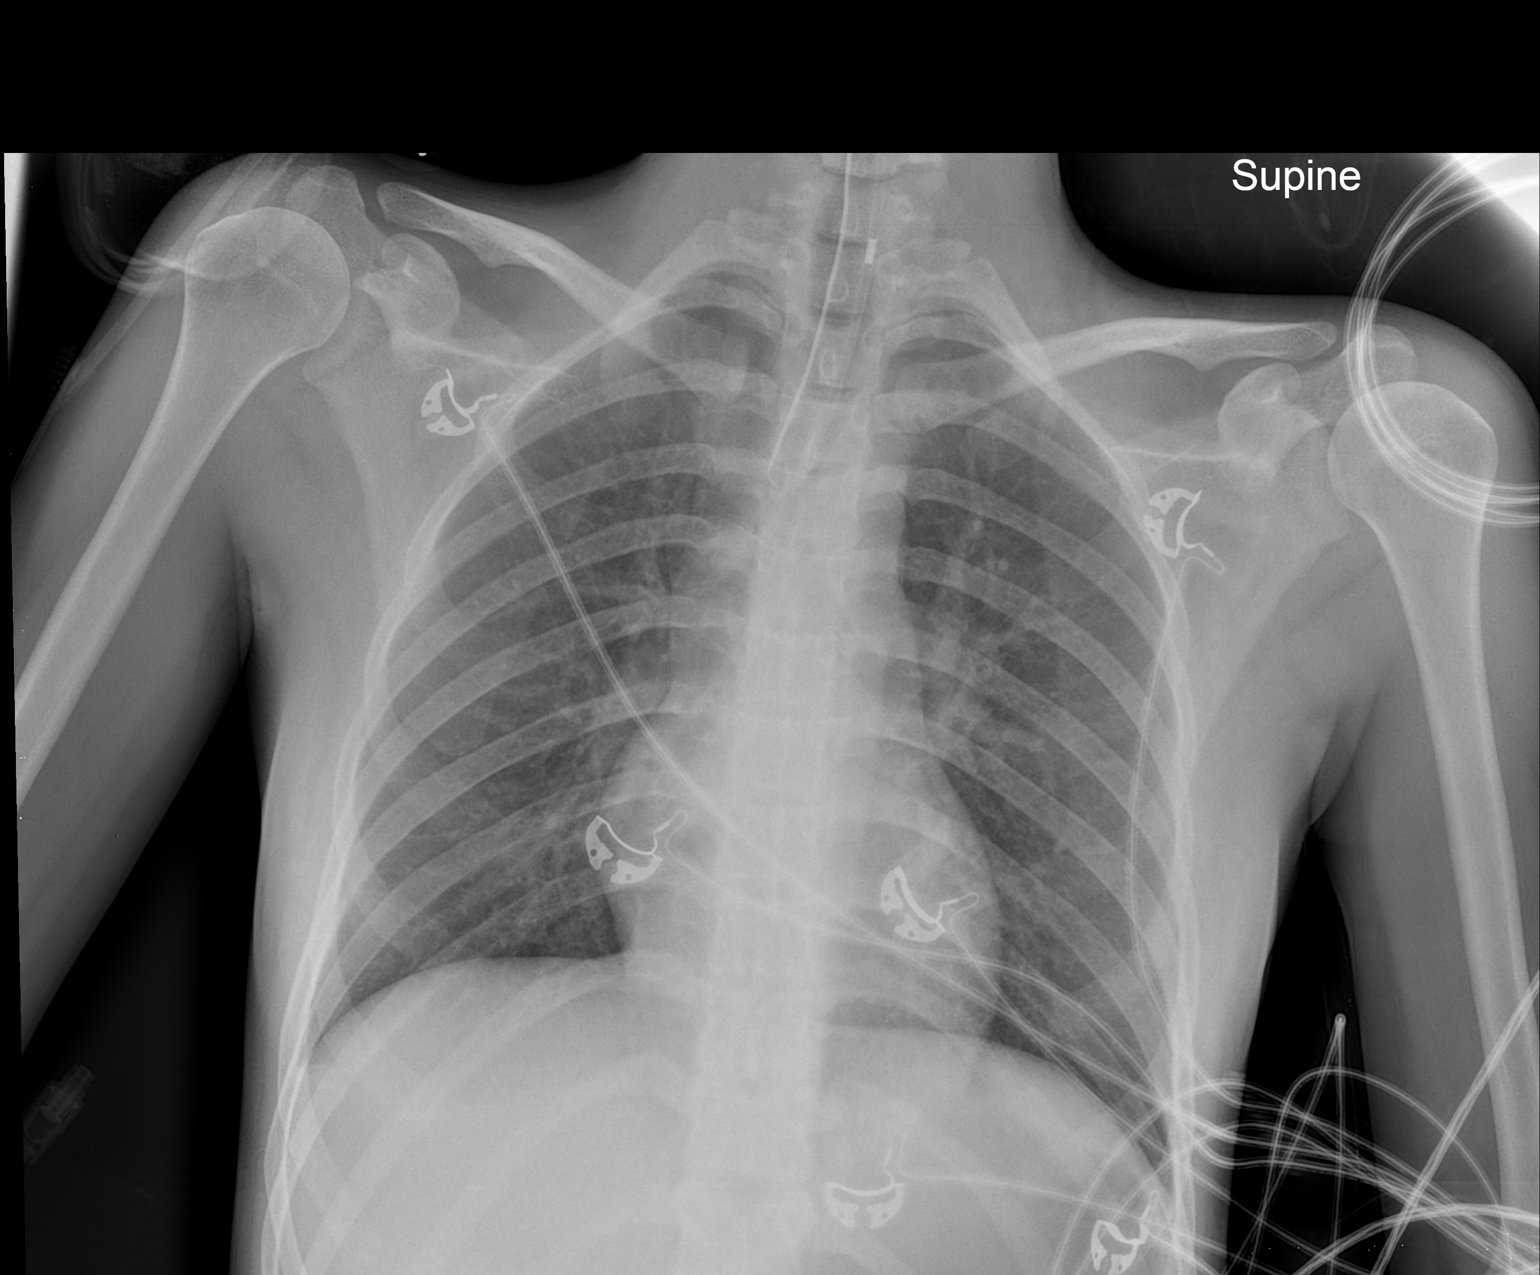

[1 of 1 positions shown; findings below may reference images not displayed]

FINDINGS: Tip of endotracheal tube 3.1 cm above carina.
Normal heart size, mediastinal contours, and pulmonary vascularity.
Lungs clear.
No pleural effusion or pneumothorax.
Osseous structures unremarkable.
IMPRESSION: Satisfactory endotracheal tube position.
No acute abnormalities.

## 2013-09-24 DEATH — deceased

## 2013-11-01 ENCOUNTER — Telehealth: Payer: Self-pay

## 2013-11-01 NOTE — Telephone Encounter (Signed)
Patient past away per Obituary in [employer] °
